# Patient Record
Sex: Female | Born: 1960 | Race: Black or African American | Hispanic: No | Marital: Single | Smoking: Former smoker
Health system: Southern US, Community
[De-identification: ages and names within clinical notes are randomized; demographics above are authoritative.]

## PROBLEM LIST (undated history)

## (undated) DIAGNOSIS — K219 Gastro-esophageal reflux disease without esophagitis: Secondary | ICD-10-CM

## (undated) DIAGNOSIS — D649 Anemia, unspecified: Secondary | ICD-10-CM

## (undated) DIAGNOSIS — E119 Type 2 diabetes mellitus without complications: Secondary | ICD-10-CM

## (undated) DIAGNOSIS — F419 Anxiety disorder, unspecified: Secondary | ICD-10-CM

## (undated) DIAGNOSIS — F329 Major depressive disorder, single episode, unspecified: Secondary | ICD-10-CM

## (undated) DIAGNOSIS — I10 Essential (primary) hypertension: Secondary | ICD-10-CM

## (undated) DIAGNOSIS — F32A Depression, unspecified: Secondary | ICD-10-CM

---

## 2013-06-09 ENCOUNTER — Emergency Department (HOSPITAL_COMMUNITY)
Admission: EM | Admit: 2013-06-09 | Discharge: 2013-06-09 | Disposition: A | Discharge status: Home / Self Care | Payer: Medicaid Other | Attending: Emergency Medicine | Admitting: Emergency Medicine

## 2013-06-09 ENCOUNTER — Encounter (HOSPITAL_COMMUNITY): Payer: Self-pay | Admitting: Emergency Medicine

## 2013-06-09 ENCOUNTER — Emergency Department (HOSPITAL_COMMUNITY): Payer: Medicaid Other

## 2013-06-09 DIAGNOSIS — R5381 Other malaise: Secondary | ICD-10-CM | POA: Insufficient documentation

## 2013-06-09 DIAGNOSIS — Z87891 Personal history of nicotine dependence: Secondary | ICD-10-CM | POA: Insufficient documentation

## 2013-06-09 DIAGNOSIS — N939 Abnormal uterine and vaginal bleeding, unspecified: Secondary | ICD-10-CM

## 2013-06-09 DIAGNOSIS — Z3202 Encounter for pregnancy test, result negative: Secondary | ICD-10-CM | POA: Insufficient documentation

## 2013-06-09 DIAGNOSIS — E119 Type 2 diabetes mellitus without complications: Secondary | ICD-10-CM | POA: Insufficient documentation

## 2013-06-09 DIAGNOSIS — R112 Nausea with vomiting, unspecified: Secondary | ICD-10-CM | POA: Insufficient documentation

## 2013-06-09 DIAGNOSIS — Z880 Allergy status to penicillin: Secondary | ICD-10-CM | POA: Insufficient documentation

## 2013-06-09 DIAGNOSIS — N949 Unspecified condition associated with female genital organs and menstrual cycle: Secondary | ICD-10-CM | POA: Insufficient documentation

## 2013-06-09 DIAGNOSIS — I10 Essential (primary) hypertension: Secondary | ICD-10-CM | POA: Insufficient documentation

## 2013-06-09 DIAGNOSIS — N938 Other specified abnormal uterine and vaginal bleeding: Secondary | ICD-10-CM | POA: Insufficient documentation

## 2013-06-09 DIAGNOSIS — Z7982 Long term (current) use of aspirin: Secondary | ICD-10-CM | POA: Insufficient documentation

## 2013-06-09 DIAGNOSIS — Z79899 Other long term (current) drug therapy: Secondary | ICD-10-CM | POA: Insufficient documentation

## 2013-06-09 DIAGNOSIS — R0602 Shortness of breath: Secondary | ICD-10-CM | POA: Insufficient documentation

## 2013-06-09 HISTORY — DX: Essential (primary) hypertension: I10

## 2013-06-09 HISTORY — DX: Type 2 diabetes mellitus without complications: E11.9

## 2013-06-09 LAB — ABO/RH: ABO/RH(D): O POS

## 2013-06-09 LAB — POCT I-STAT, CHEM 8
Calcium, Ion: 1.1 mmol/L — ABNORMAL LOW (ref 1.12–1.23)
Glucose, Bld: 209 mg/dL — ABNORMAL HIGH (ref 70–99)
HCT: 28 % — ABNORMAL LOW (ref 36.0–46.0)
Hemoglobin: 9.5 g/dL — ABNORMAL LOW (ref 12.0–15.0)
TCO2: 21 mmol/L (ref 0–100)

## 2013-06-09 LAB — CBC WITH DIFFERENTIAL/PLATELET
HCT: 23.2 % — ABNORMAL LOW (ref 36.0–46.0)
Hemoglobin: 7.7 g/dL — ABNORMAL LOW (ref 12.0–15.0)
Lymphs Abs: 2.2 10*3/uL (ref 0.7–4.0)
MCH: 24.9 pg — ABNORMAL LOW (ref 26.0–34.0)
Monocytes Absolute: 1 10*3/uL (ref 0.1–1.0)
Monocytes Relative: 7 % (ref 3–12)
Neutro Abs: 10.5 10*3/uL — ABNORMAL HIGH (ref 1.7–7.7)
Neutrophils Relative %: 77 % (ref 43–77)
RBC: 3.09 MIL/uL — ABNORMAL LOW (ref 3.87–5.11)

## 2013-06-09 LAB — WET PREP, GENITAL
Clue Cells Wet Prep HPF POC: NONE SEEN
Trich, Wet Prep: NONE SEEN

## 2013-06-09 LAB — URINALYSIS, ROUTINE W REFLEX MICROSCOPIC
Bilirubin Urine: NEGATIVE
Ketones, ur: >80 mg/dL — AB
Nitrite: NEGATIVE
Protein, ur: 30 mg/dL — AB
Urobilinogen, UA: 0.2 mg/dL (ref 0.0–1.0)

## 2013-06-09 LAB — URINE MICROSCOPIC-ADD ON

## 2013-06-09 LAB — TYPE AND SCREEN: Antibody Screen: NEGATIVE

## 2013-06-09 MED ORDER — FERROUS SULFATE 325 (65 FE) MG PO TABS: 325.0000 mg | ORAL_TABLET | Freq: Every day | ORAL | Status: DC

## 2013-06-09 MED ORDER — MEGESTROL ACETATE 40 MG PO TABS: 40.0000 mg | ORAL_TABLET | Freq: Four times a day (QID) | ORAL | Status: DC

## 2013-06-09 MED ORDER — POTASSIUM CHLORIDE CRYS ER 20 MEQ PO TBCR
40.0000 meq | EXTENDED_RELEASE_TABLET | Freq: Once | ORAL | Status: AC
  Administered 2013-06-09: 40 meq via ORAL
  Filled 2013-06-09: qty 2

## 2013-06-09 MED ORDER — SODIUM CHLORIDE 0.9 % IV BOLUS (SEPSIS)
1000.0000 mL | Freq: Once | INTRAVENOUS | Status: AC
  Administered 2013-06-09: 1000 mL via INTRAVENOUS

## 2013-06-09 NOTE — ED Notes (Signed)
ZOX:[id number]<AV> Expected date:<BR> Expected time:<BR> Means of arrival:<BR> Comments:<BR> EMS-N/V

## 2013-06-09 NOTE — ED Notes (Addendum)
Per EMS. Pt started feeling weak a 2 weeks ago and started having vaginal bleeding with clots that has been intermittent ever since. Pt also noted that her abdomen was distended. Pt reports "I have lost lots of blood" and "I feel week". Pt report not wanting to eat. Some nausea and vomiting today. CBG 178.

## 2013-06-09 NOTE — ED Provider Notes (Signed)
History    CSN: [id number] Arrival date & time 06/09/13  1343  First MD Initiated Contact with Patient 06/09/13 1416     Chief Complaint  Patient presents with  . Vaginal Bleeding   (Consider location/radiation/quality/duration/timing/severity/associated sxs/prior Treatment) HPI Comments: Patient presents to the emergency department with chief complaint of vaginal bleeding. She states that she has been noticing dark red clots from the vagina for 8 days. She states that she's been using approximately 18 pads per day. Additionally, she states that she feels very weak and out of breath. She states that several days ago her stomach was distended, but it is no longer. She denies any chest pain, abdominal pain, diarrhea or constipation. She endorses associated nausea and vomiting which started today. She denies any pain.  Patient is a 52 y.o. female presenting with vaginal bleeding. The history is provided by the patient. No language interpreter was used.  Vaginal Bleeding Quality:  Dark red and heavier than menses Severity:  Moderate Onset quality:  Gradual Duration:  8 days Timing:  Constant Progression:  Unchanged Chronicity:  New Menstrual history: Perimenopause. Number of pads used:  18 per day Possible pregnancy: no   Relieved by:  Nothing Worsened by:  Nothing tried Ineffective treatments:  None tried Associated symptoms: fatigue   Associated symptoms: no abdominal pain, no back pain, no dizziness, no dyspareunia, no dysuria, no fever, no nausea and no vaginal discharge   Associated symptoms comment:  Shortness of breath  Past Medical History  Diagnosis Date  . Hypertension   . Diabetes mellitus without complication    No past surgical history on file. No family history on file. History  Substance Use Topics  . Smoking status: Former Games developer  . Smokeless tobacco: Not on file  . Alcohol Use: No   OB History   Grav Para Term Preterm Abortions TAB SAB Ect Mult Living                Review of Systems  Constitutional: Positive for fatigue. Negative for fever.  Gastrointestinal: Negative for nausea and abdominal pain.  Genitourinary: Positive for vaginal bleeding. Negative for dysuria, vaginal discharge and dyspareunia.  Musculoskeletal: Negative for back pain.  Neurological: Negative for dizziness.  All other systems reviewed and are negative.    Allergies  Penicillins  Home Medications   Current Outpatient Rx  Name  Route  Sig  Dispense  Refill  . acetaminophen (TYLENOL) 500 MG tablet   Oral   Take 500 mg by mouth every 6 (six) hours as needed for pain.         Marland Kitchen aspirin 81 MG tablet   Oral   Take 81 mg by mouth daily.         Marland Kitchen atenolol (TENORMIN) 50 MG tablet   Oral   Take 50 mg by mouth daily.         . cloNIDine (CATAPRES) 0.1 MG tablet   Oral   Take 0.1 mg by mouth 2 (two) times daily.         . Fish Oil OIL   Oral   Take 1 capsule by mouth daily.         Marland Kitchen glipiZIDE (GLUCOTROL) 5 MG tablet   Oral   Take 10 mg by mouth 2 (two) times daily before a meal.          . hydrochlorothiazide (HYDRODIURIL) 25 MG tablet   Oral   Take 25 mg by mouth daily.         Marland Kitchen  metFORMIN (GLUCOPHAGE) 850 MG tablet   Oral   Take 850 mg by mouth 2 (two) times daily with a meal.         . SLO-NIACIN PO   Oral   Take 1 tablet by mouth daily.          BP 161/85  Pulse 102  Temp(Src) 98.4 F (36.9 C) (Oral)  Resp 22  SpO2 100%  LMP 05/26/2013 Physical Exam  Nursing note and vitals reviewed. Constitutional: She is oriented to person, place, and time. She appears well-developed and well-nourished.  HENT:  Head: Normocephalic and atraumatic.  Eyes: Conjunctivae and EOM are normal. Pupils are equal, round, and reactive to light.  Neck: Normal range of motion. Neck supple.  Cardiovascular: Normal rate and regular rhythm.  Exam reveals no gallop and no friction rub.   No murmur heard. Pulmonary/Chest: Effort normal and  breath sounds normal. No respiratory distress. She has no wheezes. She has no rales. She exhibits no tenderness.  Abdominal: Soft. Bowel sounds are normal. She exhibits no distension and no mass. There is no tenderness. There is no rebound and no guarding.  Musculoskeletal: Normal range of motion. She exhibits no edema and no tenderness.  Neurological: She is alert and oriented to person, place, and time.  Skin: Skin is warm and dry.  Psychiatric: She has a normal mood and affect. Her behavior is normal. Judgment and thought content normal.    ED Course  Procedures (including critical care time) Labs Reviewed  WET PREP, GENITAL  GC/CHLAMYDIA PROBE AMP  CBC WITH DIFFERENTIAL  URINALYSIS, ROUTINE W REFLEX MICROSCOPIC  TYPE AND SCREEN  ED ECG REPORT  I personally interpreted this EKG   Date: 06/09/2013   Rate: 91  Rhythm: normal sinus rhythm  QRS Axis: normal  Intervals: normal  ST/T Wave abnormalities: normal  Conduction Disutrbances:none  Narrative Interpretation:   Old EKG Reviewed: none available   Results for orders placed during the hospital encounter of 06/09/13  WET PREP, GENITAL      Result Value Range   Yeast Wet Prep HPF POC NONE SEEN  NONE SEEN   Trich, Wet Prep NONE SEEN  NONE SEEN   Clue Cells Wet Prep HPF POC NONE SEEN  NONE SEEN   WBC, Wet Prep HPF POC FEW (*) NONE SEEN  CBC WITH DIFFERENTIAL      Result Value Range   WBC 13.7 (*) 4.0 - 10.5 K/uL   RBC 3.09 (*) 3.87 - 5.11 MIL/uL   Hemoglobin 7.7 (*) 12.0 - 15.0 g/dL   HCT 45.4 (*) 09.8 - 11.9 %   MCV 75.1 (*) 78.0 - 100.0 fL   MCH 24.9 (*) 26.0 - 34.0 pg   MCHC 33.2  30.0 - 36.0 g/dL   RDW 14.7 (*) 82.9 - 56.2 %   Platelets 741 (*) 150 - 400 K/uL   Neutrophils Relative % 77  43 - 77 %   Neutro Abs 10.5 (*) 1.7 - 7.7 K/uL   Lymphocytes Relative 16  12 - 46 %   Lymphs Abs 2.2  0.7 - 4.0 K/uL   Monocytes Relative 7  3 - 12 %   Monocytes Absolute 1.0  0.1 - 1.0 K/uL   Eosinophils Relative 0  0 - 5 %    Eosinophils Absolute 0.0  0.0 - 0.7 K/uL   Basophils Relative 0  0 - 1 %   Basophils Absolute 0.0  0.0 - 0.1 K/uL  POCT I-STAT, CHEM 8  Result Value Range   Sodium 136  135 - 145 mEq/L   Potassium 3.1 (*) 3.5 - 5.1 mEq/L   Chloride 100  96 - 112 mEq/L   BUN 11  6 - 23 mg/dL   Creatinine, Ser 1.61  0.50 - 1.10 mg/dL   Glucose, Bld 096 (*) 70 - 99 mg/dL   Calcium, Ion 0.45 (*) 1.12 - 1.23 mmol/L   TCO2 21  0 - 100 mmol/L   Hemoglobin 9.5 (*) 12.0 - 15.0 g/dL   HCT 40.9 (*) 81.1 - 91.4 %  POCT I-STAT TROPONIN I      Result Value Range   Troponin i, poc 0.00  0.00 - 0.08 ng/mL   Comment 3           POCT PREGNANCY, URINE      Result Value Range   Preg Test, Ur NEGATIVE  NEGATIVE   Dg Chest Port 1 View  06/09/2013   *RADIOLOGY REPORT*  Clinical Data: Short of breath  PORTABLE CHEST - 1 VIEW  Comparison: None.  Findings: The lungs are well-aerated and free from pulmonary edema, focal airspace consolidation or pulmonary nodule.  Cardiac and mediastinal contours are within normal limits.  No pneumothorax, or pleural effusion. No acute osseous findings. Degenerative changes in the bilateral acromioclavicular joints.    IMPRESSION:  No acute cardiopulmonary disease.   Original Report Authenticated By: Malachy Moan, M.D.     1. Vaginal bleeding     MDM  Patient with vaginal bleeding x 8 days.  18 pads per day.  With associated SOB.  However, no pain.  Discussed with Dr. Jodi Mourning, who recommends calling Gyn and getting their input regarding outpatient f/u.  3:34 PM Discussed the patient with Dr. Shawnie Pons from [facility].  Dr. Tawni Levy office will arrange for follow-up.  She recommends starting 40 mg Megase QID and tapering down to BID as bleeding stops and an iron supplement.  Outpatient imaging.  4:03 PM Patient seen by and discussed with Dr. Jodi Mourning, who agrees with plan.       Roxy Horseman, PA-C 06/09/13 [phone]

## 2013-06-10 ENCOUNTER — Observation Stay (HOSPITAL_COMMUNITY): Payer: Medicaid Other

## 2013-06-10 ENCOUNTER — Encounter: Payer: Self-pay | Admitting: Obstetrics & Gynecology

## 2013-06-10 ENCOUNTER — Observation Stay (HOSPITAL_COMMUNITY)
Admission: AD | Admit: 2013-06-10 | Discharge: 2013-06-11 | Disposition: A | Discharge status: Home / Self Care | Payer: Medicaid Other | Source: Ambulatory Visit | Attending: Obstetrics & Gynecology | Admitting: Obstetrics & Gynecology

## 2013-06-10 ENCOUNTER — Encounter (HOSPITAL_COMMUNITY): Payer: Self-pay | Admitting: *Deleted

## 2013-06-10 ENCOUNTER — Ambulatory Visit (INDEPENDENT_AMBULATORY_CARE_PROVIDER_SITE_OTHER): Payer: Medicaid Other | Admitting: Obstetrics & Gynecology

## 2013-06-10 VITALS — Ht 62.0 in

## 2013-06-10 DIAGNOSIS — N920 Excessive and frequent menstruation with regular cycle: Secondary | ICD-10-CM

## 2013-06-10 DIAGNOSIS — D500 Iron deficiency anemia secondary to blood loss (chronic): Secondary | ICD-10-CM | POA: Insufficient documentation

## 2013-06-10 DIAGNOSIS — D219 Benign neoplasm of connective and other soft tissue, unspecified: Secondary | ICD-10-CM

## 2013-06-10 DIAGNOSIS — I10 Essential (primary) hypertension: Secondary | ICD-10-CM | POA: Insufficient documentation

## 2013-06-10 DIAGNOSIS — E119 Type 2 diabetes mellitus without complications: Secondary | ICD-10-CM | POA: Insufficient documentation

## 2013-06-10 DIAGNOSIS — N841 Polyp of cervix uteri: Secondary | ICD-10-CM | POA: Insufficient documentation

## 2013-06-10 DIAGNOSIS — N83209 Unspecified ovarian cyst, unspecified side: Secondary | ICD-10-CM

## 2013-06-10 DIAGNOSIS — D649 Anemia, unspecified: Secondary | ICD-10-CM

## 2013-06-10 DIAGNOSIS — D259 Leiomyoma of uterus, unspecified: Secondary | ICD-10-CM | POA: Insufficient documentation

## 2013-06-10 LAB — CBC
HCT: 21.7 % — ABNORMAL LOW (ref 36.0–46.0)
MCV: 75.6 fL — ABNORMAL LOW (ref 78.0–100.0)
Platelets: 643 10*3/uL — ABNORMAL HIGH (ref 150–400)
RBC: 2.87 MIL/uL — ABNORMAL LOW (ref 3.87–5.11)
WBC: 12.2 10*3/uL — ABNORMAL HIGH (ref 4.0–10.5)

## 2013-06-10 LAB — GC/CHLAMYDIA PROBE AMP
CT Probe RNA: NEGATIVE
GC Probe RNA: NEGATIVE

## 2013-06-10 LAB — HEMOGLOBIN A1C: Hgb A1c MFr Bld: 6.8 % — ABNORMAL HIGH (ref <5.7)

## 2013-06-10 LAB — ABO/RH: ABO/RH(D): O POS

## 2013-06-10 LAB — PREPARE RBC (CROSSMATCH)

## 2013-06-10 MED ORDER — ATENOLOL 50 MG PO TABS
50.0000 mg | ORAL_TABLET | Freq: Two times a day (BID) | ORAL | Status: DC
  Filled 2013-06-10 (×2): qty 1

## 2013-06-10 MED ORDER — PRENATAL MULTIVITAMIN CH: 1.0000 | ORAL_TABLET | Freq: Every day | ORAL | Status: DC

## 2013-06-10 MED ORDER — IBUPROFEN 600 MG PO TABS: 600.0000 mg | ORAL_TABLET | Freq: Four times a day (QID) | ORAL | Status: DC | PRN

## 2013-06-10 MED ORDER — FUROSEMIDE 10 MG/ML IJ SOLN
20.0000 mg | Freq: Once | INTRAMUSCULAR | Status: DC
  Filled 2013-06-10: qty 2

## 2013-06-10 MED ORDER — ZOLPIDEM TARTRATE 5 MG PO TABS: 5.0000 mg | ORAL_TABLET | Freq: Every evening | ORAL | Status: DC | PRN

## 2013-06-10 MED ORDER — DIPHENHYDRAMINE HCL 25 MG PO CAPS
25.0000 mg | ORAL_CAPSULE | Freq: Once | ORAL | Status: AC
  Administered 2013-06-10: 25 mg via ORAL
  Filled 2013-06-10: qty 1

## 2013-06-10 MED ORDER — INSULIN ASPART 100 UNIT/ML ~~LOC~~ SOLN: 0.0000 [IU] | Freq: Three times a day (TID) | SUBCUTANEOUS | Status: DC

## 2013-06-10 MED ORDER — SODIUM CHLORIDE 0.9 % IV SOLN
INTRAVENOUS | Status: DC
  Administered 2013-06-10 – 2013-06-11 (×2): via INTRAVENOUS

## 2013-06-10 MED ORDER — ACETAMINOPHEN 325 MG PO TABS
650.0000 mg | ORAL_TABLET | Freq: Once | ORAL | Status: AC
  Administered 2013-06-10: 650 mg via ORAL
  Filled 2013-06-10: qty 2

## 2013-06-10 MED ORDER — CLONIDINE HCL 0.1 MG PO TABS
0.1000 mg | ORAL_TABLET | Freq: Two times a day (BID) | ORAL | Status: DC
  Filled 2013-06-10 (×2): qty 1

## 2013-06-10 MED ORDER — HYDROCHLOROTHIAZIDE 25 MG PO TABS
25.0000 mg | ORAL_TABLET | Freq: Every day | ORAL | Status: DC
  Filled 2013-06-10: qty 1

## 2013-06-10 MED ORDER — ONDANSETRON HCL 4 MG PO TABS: 4.0000 mg | ORAL_TABLET | Freq: Four times a day (QID) | ORAL | Status: DC | PRN

## 2013-06-10 MED ORDER — ONDANSETRON HCL 4 MG/2ML IJ SOLN: 4.0000 mg | Freq: Four times a day (QID) | INTRAMUSCULAR | Status: DC | PRN

## 2013-06-10 NOTE — H&P (Signed)
Patient ID: Marie Fox, female DOB: [birth year], 52 y.o. MRN: [id number]  HPI  52 yo G0P0 Patient's last menstrual period was 05/26/2013. AA lady who was seen in the ER yesterday with heavy VB. A wet prep and cervical cultures were sent and she was started on megace 40 mg daily. She reports that over the last year her periods have been irregular, skipping and then bleeding very heavily the next month. She has also had some hot flashes. Her hbg was 7.7  Past Medical History  Diagnosis Date  . Hypertension   . Diabetes mellitus without complication   No past surgical history on file. No current facility-administered medications on file prior to encounter.   Current Outpatient Prescriptions on File Prior to Encounter  Medication Sig Dispense Refill  . acetaminophen (TYLENOL) 500 MG tablet Take 500 mg by mouth every 6 (six) hours as needed for pain.      Marland Kitchen aspirin 81 MG tablet Take 81 mg by mouth daily.      Marland Kitchen atenolol (TENORMIN) 50 MG tablet Take 50 mg by mouth daily.      . cloNIDine (CATAPRES) 0.1 MG tablet Take 0.1 mg by mouth 2 (two) times daily.      . ferrous sulfate 325 (65 FE) MG tablet Take 1 tablet (325 mg total) by mouth daily.  30 tablet  0  . Fish Oil OIL Take 1 capsule by mouth daily.      Marland Kitchen glipiZIDE (GLUCOTROL) 5 MG tablet Take 10 mg by mouth 2 (two) times daily before a meal.       . hydrochlorothiazide (HYDRODIURIL) 25 MG tablet Take 25 mg by mouth daily.      . megestrol (MEGACE) 40 MG tablet Take 1 tablet (40 mg total) by mouth 4 (four) times daily.  28 tablet  1  . metFORMIN (GLUCOPHAGE) 850 MG tablet Take 850 mg by mouth 2 (two) times daily with a meal.      . SLO-NIACIN PO Take 1 tablet by mouth daily.       Allergies  Allergen Reactions  . Penicillins Nausea Only   History   Social History  . Marital Status: Unknown    Spouse Name: N/A    Number of Children: N/A  . Years of Education: N/A   Occupational History  . Not on file.   Social History Main Topics  .  Smoking status: Former Games developer  . Smokeless tobacco: Not on file  . Alcohol Use: No  . Drug Use: Not on file  . Sexually Active: Not Currently   Other Topics Concern  . Not on file   Social History Narrative  . No narrative on file     Review of Systems Nausea and emesis occasionally. No fever. Bleeding per vagina. No pain, no discharge. Feels tired, weak  No recent pap smear.  Objective:   Physical Exam  Filed Vitals:   06/10/13 1636  BP: 122/74  Pulse: 88  Temp: 98.4 F (36.9 C)  TempSrc: Oral  Resp: 18  SpO2: 99%  NAD, pleasant Chest clear Cor RRR Abdomen not distended 5 cm prolapsed fibroid, moderate to large amount of bleeding noted  Bimanual exam reveals a ULN size uterus and non-palpable adnexa  CBC    Component Value Date/Time   WBC 12.2* 06/10/2013 1535   RBC 2.87* 06/10/2013 1535   HGB 7.2* 06/10/2013 1535   HCT 21.7* 06/10/2013 1535   PLT 643* 06/10/2013 1535   MCV 75.6* 06/10/2013 1535  MCH 25.1* 06/10/2013 1535   MCHC 33.2 06/10/2013 1535   RDW 18.4* 06/10/2013 1535   LYMPHSABS 2.2 06/09/2013 1440   MONOABS 1.0 06/09/2013 1440   EOSABS 0.0 06/09/2013 1440   BASOSABS 0.0 06/09/2013 1440    CMP     Component Value Date/Time   NA 136 06/09/2013 1503   K 3.1* 06/09/2013 1503   CL 100 06/09/2013 1503   GLUCOSE 209* 06/09/2013 1503   BUN 11 06/09/2013 1503   CREATININE 1.10 06/09/2013 1503      Assessment & Plan:   She will be admitted overnight for an U/S and transfusion of 3 units of PRBC in preparation for removal of fibroid in the OR tomorrow. She was seen in clinic by Dr. Marice Potter who counseled the patient and scheduled the surgery for her to perform.  Adam Phenix, MD 06/10/2013 4:44 PM

## 2013-06-10 NOTE — MAU Note (Signed)
Patient is to be a direct admit to the Women's Unit.

## 2013-06-10 NOTE — Progress Notes (Signed)
  Subjective:    Patient ID: Marie Fox, female    DOB: [birth year], 52 y.o.   MRN: [id number]  HPI  52 yo AA lady who was seen in the ER yesterday with heavy VB. A wet prep and cervical cultures were sent and she was started on megace 40 mg daily. She reports that over the last year her periods have been irregular, skipping and then bleeding very heavily the next month. She has also had some hot flashes. Her hbg was 7.7  Review of Systems    No recent pap smear. Objective:   Physical Exam  5 cm prolapsed fibroid, moderate to large amount of bleeding noted Bimanual exam reveals a ULN size uterus and non-palpable adnexa      Assessment & Plan:  She will be admitted overnight for an u/s and transfusion of 3 units of PRBC in preparation for removal of fibroid in the OR tomorrow.

## 2013-06-10 NOTE — ED Provider Notes (Signed)
Medical screening examination/treatment/procedure(s) were conducted as a shared visit with non-physician practitioner(s) or resident  and myself.  I personally evaluated the patient during the encounter and agree with the findings and plan unless otherwise indicated.  Anemia with vaginal bleeding.  Concern for hormonal vs less likely malignancy. Pt denies blood thinners. PA spoke with specialist for close outpt fup. Well appearing on exam.  Medicines given at dc.  Lightheadedness resolved with fluids.  Fup discussed.   Enid Skeens, MD 06/10/13 [phone]

## 2013-06-11 ENCOUNTER — Ambulatory Visit (HOSPITAL_COMMUNITY)
Admission: RE | Admit: 2013-06-11 | Payer: Medicaid Other | Source: Ambulatory Visit | Admitting: Obstetrics & Gynecology

## 2013-06-11 ENCOUNTER — Observation Stay (HOSPITAL_COMMUNITY): Payer: Medicaid Other | Admitting: Anesthesiology

## 2013-06-11 ENCOUNTER — Encounter (HOSPITAL_COMMUNITY): Payer: Self-pay | Admitting: Anesthesiology

## 2013-06-11 ENCOUNTER — Encounter (HOSPITAL_COMMUNITY)
Admission: AD | Disposition: A | Discharge status: Home / Self Care | Payer: Self-pay | Source: Ambulatory Visit | Attending: Obstetrics & Gynecology

## 2013-06-11 DIAGNOSIS — N920 Excessive and frequent menstruation with regular cycle: Principal | ICD-10-CM

## 2013-06-11 DIAGNOSIS — D259 Leiomyoma of uterus, unspecified: Secondary | ICD-10-CM

## 2013-06-11 DIAGNOSIS — D219 Benign neoplasm of connective and other soft tissue, unspecified: Secondary | ICD-10-CM

## 2013-06-11 HISTORY — PX: HYSTEROSCOPY WITH D & C: SHX1775

## 2013-06-11 SURGERY — DILATATION AND CURETTAGE /HYSTEROSCOPY
Anesthesia: General | Site: Vagina | Wound class: Contaminated

## 2013-06-11 MED ORDER — ONDANSETRON HCL 4 MG/2ML IJ SOLN
INTRAMUSCULAR | Status: DC | PRN
  Administered 2013-06-11: 4 mg via INTRAVENOUS

## 2013-06-11 MED ORDER — FENTANYL CITRATE 0.05 MG/ML IJ SOLN
INTRAMUSCULAR | Status: DC | PRN
  Administered 2013-06-11: 100 ug via INTRAVENOUS

## 2013-06-11 MED ORDER — FENTANYL CITRATE 0.05 MG/ML IJ SOLN: 25.0000 ug | INTRAMUSCULAR | Status: DC | PRN

## 2013-06-11 MED ORDER — KETOROLAC TROMETHAMINE 30 MG/ML IJ SOLN: 15.0000 mg | Freq: Once | INTRAMUSCULAR | Status: DC | PRN

## 2013-06-11 MED ORDER — BUPIVACAINE HCL (PF) 0.5 % IJ SOLN
INTRAMUSCULAR | Status: DC | PRN
  Administered 2013-06-11: 10 mL

## 2013-06-11 MED ORDER — MIDAZOLAM HCL 5 MG/5ML IJ SOLN
INTRAMUSCULAR | Status: DC | PRN
  Administered 2013-06-11: 2 mg via INTRAVENOUS

## 2013-06-11 MED ORDER — LIDOCAINE HCL (CARDIAC) 20 MG/ML IV SOLN
INTRAVENOUS | Status: DC | PRN
  Administered 2013-06-11: 80 mg via INTRAVENOUS

## 2013-06-11 MED ORDER — MIDAZOLAM HCL 2 MG/2ML IJ SOLN: 0.5000 mg | Freq: Once | INTRAMUSCULAR | Status: DC | PRN

## 2013-06-11 MED ORDER — MEPERIDINE HCL 25 MG/ML IJ SOLN: 6.2500 mg | INTRAMUSCULAR | Status: DC | PRN

## 2013-06-11 MED ORDER — PROMETHAZINE HCL 25 MG/ML IJ SOLN: 6.2500 mg | INTRAMUSCULAR | Status: DC | PRN

## 2013-06-11 MED ORDER — PROPOFOL 10 MG/ML IV BOLUS
INTRAVENOUS | Status: DC | PRN
  Administered 2013-06-11: 100 mg via INTRAVENOUS
  Administered 2013-06-11: 200 mg via INTRAVENOUS

## 2013-06-11 MED ORDER — LACTATED RINGERS IV SOLN
INTRAVENOUS | Status: DC | PRN
  Administered 2013-06-11: 08:00:00 via INTRAVENOUS

## 2013-06-11 MED ORDER — IBUPROFEN 800 MG PO TABS: 800.0000 mg | ORAL_TABLET | Freq: Three times a day (TID) | ORAL | Status: DC | PRN

## 2013-06-11 SURGICAL SUPPLY — 17 items
CANISTER SUCTION 2500CC (MISCELLANEOUS) ×2 IMPLANT
CATH ROBINSON RED A/P 16FR (CATHETERS) ×2 IMPLANT
CLOTH BEACON ORANGE TIMEOUT ST (SAFETY) ×2 IMPLANT
CONTAINER PREFILL 10% NBF 60ML (FORM) ×4 IMPLANT
DILATOR CANAL MILEX (MISCELLANEOUS) IMPLANT
DRESSING TELFA 8X3 (GAUZE/BANDAGES/DRESSINGS) ×2 IMPLANT
ELECT REM PT RETURN 9FT ADLT (ELECTROSURGICAL)
ELECTRODE REM PT RTRN 9FT ADLT (ELECTROSURGICAL) IMPLANT
GLOVE BIO SURGEON STRL SZ 6.5 (GLOVE) ×2 IMPLANT
GLOVE BIOGEL PI IND STRL 7.0 (GLOVE) ×1 IMPLANT
GLOVE BIOGEL PI INDICATOR 7.0 (GLOVE) ×1
GOWN STRL REIN XL XLG (GOWN DISPOSABLE) ×4 IMPLANT
LOOP ANGLED CUTTING 22FR (CUTTING LOOP) IMPLANT
PACK HYSTEROSCOPY LF (CUSTOM PROCEDURE TRAY) ×2 IMPLANT
PAD OB MATERNITY 4.3X12.25 (PERSONAL CARE ITEMS) ×2 IMPLANT
TOWEL OR 17X24 6PK STRL BLUE (TOWEL DISPOSABLE) ×4 IMPLANT
WATER STERILE IRR 1000ML POUR (IV SOLUTION) ×2 IMPLANT

## 2013-06-11 NOTE — Progress Notes (Signed)
To the OR.

## 2013-06-11 NOTE — Transfer of Care (Signed)
Immediate Anesthesia Transfer of Care Note  Patient: Marie Fox  Procedure(s) Performed: Procedure(s): DILATATION AND CURETTAGE /HYSTEROSCOPY/myomyectomy (N/A)  Patient Location: PACU  Anesthesia Type:General  Level of Consciousness: awake, alert  and oriented  Airway & Oxygen Therapy: Patient Spontanous Breathing and Patient connected to nasal cannula oxygen  Post-op Assessment: Report given to PACU RN and Post -op Vital signs reviewed and stable  Post vital signs: Reviewed and stable  Complications: No apparent anesthesia complications

## 2013-06-11 NOTE — Op Note (Signed)
06/10/2013 - 06/11/2013  8:20 AM  PATIENT:  Marie Fox  52 y.o. female  PRE-OPERATIVE DIAGNOSIS:  prolapse fibroid, menorrhagia, anemia  POST-OPERATIVE DIAGNOSIS:  same  PROCEDURE:  MYOMECTOMY AND CURETTAGE  SURGEON:  Surgeon(s) and Role:    * Allie Bossier, MD - Primary  PHYSICIAN ASSISTANT:   ASSISTANTS: none   ANESTHESIA:   general  EBL:  Total I/O In: -  Out: 5 [Blood:5]  BLOOD ADMINISTERED:none  DRAINS: none   LOCAL MEDICATIONS USED:  MARCAINE     SPECIMEN:  Source of Specimen:  fibroid and uterine curettings  DISPOSITION OF SPECIMEN:  PATHOLOGY  COUNTS:  YES  TOURNIQUET:  * No tourniquets in log *  DICTATION: .Dragon Dictation  PLAN OF CARE: Discharge to home after PACU  PATIENT DISPOSITION:  PACU - hemodynamically stable.   Delay start of Pharmacological VTE agent (>24hrs) due to surgical blood loss or risk of bleeding: not applicable  The risks, benefits, and alternatives were explained, understood, accepted. The consents were signed. All questions were answered. I did mention that if hemorrhage occurred I may have to do a hysterectomy. She was taken to the operating room and placed in the dorsal lithotomy position. General anesthesia was applied. Her vagina was prepped and draped in the usual fashion. A weighted speculum was placed in her vagina. The large pedunculated fibroid was noted. I did a paracervical block with 10 mL of 0.5% Marcaine. I then twisted the pedunculated fibroid on total it came off of its pedicle. No bleeding was noted. I then used a small curette to perform a curettage of the uterine lining. Hemostasis was noted. She was extubated and taken to the recovery room in stable condition. She tolerated the procedure well.

## 2013-06-11 NOTE — Anesthesia Procedure Notes (Signed)
Procedure Name: LMA Insertion Date/Time: 06/11/2013 8:25 AM Performed by: Isabella Bowens R Pre-anesthesia Checklist: Patient identified, Emergency Drugs available, Suction available, Patient being monitored and Timeout performed Patient Re-evaluated:Patient Re-evaluated prior to inductionOxygen Delivery Method: Circle system utilized Preoxygenation: Pre-oxygenation with 100% oxygen Intubation Type: IV induction Ventilation: Mask ventilation without difficulty LMA: LMA inserted LMA Size: 4.0 Number of attempts: 1 Placement Confirmation: breath sounds checked- equal and bilateral and positive ETCO2 Dental Injury: Teeth and Oropharynx as per pre-operative assessment

## 2013-06-11 NOTE — Discharge Summary (Signed)
Physician Discharge Summary  Patient ID: Marie Fox MRN: [id number] DOB/AGE: 08-22-1961 52 y.o.  Admit date: 06/10/2013 Discharge date: 06/11/2013  Admission Diagnoses: prolapsed fibroid, menorrhagia, anemia  Discharge Diagnoses: same Active Problems:   Fibroid   Menorrhagia   Discharged Condition: good  Hospital Course: She was admitted from the gyn clinic with menorrhagia and symptomatic anemia. Her hemoglobin was 7.2. She was given 3 units of PRBCs overnight and felt much better in the morning. An u/s done showed a thin endometrium of 2 mm and a prolapsed fibroid and a small complex right adnexal mass. She underwent an uncomplicated removal of the prolapsed fibroid as well as a curettage of her endometrium the following morning. A CA-125 was ordered prior to her discharge home.  Consults: None  Significant Diagnostic Studies: ultrasound as above  Treatments: surgery: as above as well as the transfusion  Discharge Exam: Blood pressure 104/68, pulse 85, temperature 98.2 F (36.8 C), temperature source Oral, resp. rate 20, last menstrual period 06/10/2013, SpO2 100.00%. General appearance: alert Cardio: regular rate and rhythm, S1, S2 normal, no murmur, click, rub or gallop GI: soft, non-tender; bowel sounds normal; no masses,  no organomegaly  Disposition: 01-Home or Self Care  Discharge Orders   Future Orders Complete By Expires     CA 125  As directed         Medication List    STOP taking these medications       megestrol 40 MG tablet  Commonly known as:  MEGACE      TAKE these medications       acetaminophen 500 MG tablet  Commonly known as:  TYLENOL  Take 1,000-1,500 mg by mouth every 6 (six) hours as needed for pain.     aspirin 81 MG tablet  Take 81 mg by mouth daily.     atenolol 50 MG tablet  Commonly known as:  TENORMIN  Take 50 mg by mouth daily.     cloNIDine 0.1 MG tablet  Commonly known as:  CATAPRES  Take 0.1 mg by mouth 2 (two) times daily.      diphenhydrAMINE 25 MG tablet  Commonly known as:  BENADRYL  Take 50 mg by mouth daily as needed for allergies.     ferrous sulfate 325 (65 FE) MG tablet  Take 1 tablet (325 mg total) by mouth daily.     Fish Oil 1200 MG Caps  Take 1 capsule by mouth daily.     glipiZIDE 5 MG tablet  Commonly known as:  GLUCOTROL  Take 5 mg by mouth 3 (three) times daily with meals.     hydrochlorothiazide 25 MG tablet  Commonly known as:  HYDRODIURIL  Take 25 mg by mouth daily.     ibuprofen 800 MG tablet  Commonly known as:  ADVIL,MOTRIN  Take 1 tablet (800 mg total) by mouth every 8 (eight) hours as needed for pain.     metFORMIN 850 MG tablet  Commonly known as:  GLUCOPHAGE  Take 850 mg by mouth 2 (two) times daily with a meal.     niacin 500 MG tablet  Take 500 mg by mouth at bedtime.     Vitamin D-3 1000 UNITS Caps  Take 1 capsule by mouth daily.           Follow-up Information   Follow up with Tanayah Squitieri C., MD. Schedule an appointment as soon as possible for a visit in 6 weeks.   Contact information:   [address]  [address] [place] [address] [phone]       Signed: Jadarious Dobbins C. 06/11/2013, 8:30 AM

## 2013-06-11 NOTE — Anesthesia Preprocedure Evaluation (Signed)
Anesthesia Evaluation  Patient identified by MRN, date of birth, ID band Patient awake    Reviewed: Allergy & Precautions, H&P , Patient's Chart, lab work & pertinent test results, reviewed documented beta blocker date and time   History of Anesthesia Complications Negative for: history of anesthetic complications  Airway Mallampati: II TM Distance: >3 FB Neck ROM: full    Dental no notable dental hx.    Pulmonary neg pulmonary ROS,  breath sounds clear to auscultation  Pulmonary exam normal       Cardiovascular Exercise Tolerance: Good hypertension, negative cardio ROS  Rhythm:regular Rate:Normal     Neuro/Psych negative neurological ROS  negative psych ROS   GI/Hepatic negative GI ROS, Neg liver ROS,   Endo/Other  negative endocrine ROSdiabetes  Renal/GU negative Renal ROS     Musculoskeletal   Abdominal   Peds  Hematology negative hematology ROS (+)   Anesthesia Other Findings Hypertension     Diabetes mellitus without complication   Reproductive/Obstetrics negative OB ROS                           Anesthesia Physical Anesthesia Plan  ASA: III  Anesthesia Plan: General LMA   Post-op Pain Management:    Induction:   Airway Management Planned:   Additional Equipment:   Intra-op Plan:   Post-operative Plan:   Informed Consent: I have reviewed the patients History and Physical, chart, labs and discussed the procedure including the risks, benefits and alternatives for the proposed anesthesia with the patient or authorized representative who has indicated his/her understanding and acceptance.   Dental Advisory Given  Plan Discussed with: CRNA, Surgeon and Anesthesiologist  Anesthesia Plan Comments:         Anesthesia Quick Evaluation

## 2013-06-11 NOTE — Anesthesia Postprocedure Evaluation (Signed)
  Anesthesia Post Note  Patient: Marie Fox  Procedure(s) Performed: Procedure(s) (LRB): DILATATION AND CURETTAGE /HYSTEROSCOPY/myomyectomy (N/A)  Anesthesia type: GA  Patient location: PACU  Post pain: Pain level controlled  Post assessment: Post-op Vital signs reviewed  Last Vitals:  Filed Vitals:   06/11/13 0845  BP: 126/75  Pulse: 72  Temp:   Resp: 16    Post vital signs: Reviewed  Level of consciousness: sedated  Complications: No apparent anesthesia complications

## 2013-06-12 ENCOUNTER — Encounter (HOSPITAL_COMMUNITY): Payer: Self-pay | Admitting: Obstetrics & Gynecology

## 2013-06-12 LAB — TYPE AND SCREEN
ABO/RH(D): O POS
Antibody Screen: NEGATIVE
Unit division: 0

## 2013-06-16 ENCOUNTER — Encounter: Payer: Self-pay | Admitting: Obstetrics & Gynecology

## 2013-07-16 ENCOUNTER — Encounter: Payer: Self-pay | Admitting: Obstetrics & Gynecology

## 2013-07-16 ENCOUNTER — Other Ambulatory Visit (HOSPITAL_COMMUNITY)
Admission: RE | Admit: 2013-07-16 | Discharge: 2013-07-16 | Disposition: A | Discharge status: Home / Self Care | Payer: Medicaid Other | Source: Ambulatory Visit | Attending: Obstetrics & Gynecology | Admitting: Obstetrics & Gynecology

## 2013-07-16 ENCOUNTER — Ambulatory Visit (INDEPENDENT_AMBULATORY_CARE_PROVIDER_SITE_OTHER): Payer: Medicaid Other | Admitting: Obstetrics & Gynecology

## 2013-07-16 VITALS — BP 167/100 | HR 92 | Temp 97.8°F | Ht 62.0 in | Wt 157.1 lb

## 2013-07-16 DIAGNOSIS — Z01419 Encounter for gynecological examination (general) (routine) without abnormal findings: Secondary | ICD-10-CM | POA: Insufficient documentation

## 2013-07-16 DIAGNOSIS — Z0000 Encounter for general adult medical examination without abnormal findings: Secondary | ICD-10-CM

## 2013-07-16 DIAGNOSIS — Z09 Encounter for follow-up examination after completed treatment for conditions other than malignant neoplasm: Secondary | ICD-10-CM

## 2013-07-16 DIAGNOSIS — Z1151 Encounter for screening for human papillomavirus (HPV): Secondary | ICD-10-CM | POA: Insufficient documentation

## 2013-07-16 NOTE — Progress Notes (Signed)
  Subjective:    Patient ID: Marie Fox, female    DOB: [birth year], 52 y.o.   MRN: [id number]  HPI  Ms. Cordle is here for a 4 week post op visit after having her prolapsed fibroid/uterine polyp removed as well as a d&c. She also had a transfusion of 3 units PRBCs. She has had no bleeding since her surgery and feels like she has so much more energy.  Review of Systems Her pap and mammogram are due.    Objective:   Physical Exam  Normal appearing cervix      Assessment & Plan:  Post op- stable Preventative care- pap with cotesting sent Mammogram ordered RTC 1 year/prn sooner

## 2013-08-04 ENCOUNTER — Ambulatory Visit (HOSPITAL_COMMUNITY): Payer: Medicaid Other

## 2014-05-26 ENCOUNTER — Encounter (HOSPITAL_COMMUNITY): Payer: Self-pay | Admitting: Emergency Medicine

## 2014-05-26 ENCOUNTER — Emergency Department (HOSPITAL_COMMUNITY): Payer: Medicaid Other

## 2014-05-26 ENCOUNTER — Emergency Department (HOSPITAL_COMMUNITY)
Admission: EM | Admit: 2014-05-26 | Discharge: 2014-05-26 | Disposition: A | Discharge status: Home / Self Care | Payer: Medicaid Other | Attending: Emergency Medicine | Admitting: Emergency Medicine

## 2014-05-26 DIAGNOSIS — E878 Other disorders of electrolyte and fluid balance, not elsewhere classified: Secondary | ICD-10-CM | POA: Diagnosis not present

## 2014-05-26 DIAGNOSIS — Z91040 Latex allergy status: Secondary | ICD-10-CM | POA: Insufficient documentation

## 2014-05-26 DIAGNOSIS — R197 Diarrhea, unspecified: Secondary | ICD-10-CM | POA: Diagnosis not present

## 2014-05-26 DIAGNOSIS — Z880 Allergy status to penicillin: Secondary | ICD-10-CM | POA: Insufficient documentation

## 2014-05-26 DIAGNOSIS — I10 Essential (primary) hypertension: Secondary | ICD-10-CM | POA: Diagnosis not present

## 2014-05-26 DIAGNOSIS — Z79899 Other long term (current) drug therapy: Secondary | ICD-10-CM | POA: Insufficient documentation

## 2014-05-26 DIAGNOSIS — R000 Tachycardia, unspecified: Secondary | ICD-10-CM | POA: Diagnosis not present

## 2014-05-26 DIAGNOSIS — E119 Type 2 diabetes mellitus without complications: Secondary | ICD-10-CM | POA: Insufficient documentation

## 2014-05-26 DIAGNOSIS — Z87891 Personal history of nicotine dependence: Secondary | ICD-10-CM | POA: Insufficient documentation

## 2014-05-26 DIAGNOSIS — F411 Generalized anxiety disorder: Secondary | ICD-10-CM | POA: Insufficient documentation

## 2014-05-26 DIAGNOSIS — R112 Nausea with vomiting, unspecified: Secondary | ICD-10-CM | POA: Diagnosis present

## 2014-05-26 LAB — HEMOGLOBIN A1C
Hgb A1c MFr Bld: 10.5 % — ABNORMAL HIGH (ref <5.7)
Mean Plasma Glucose: 255 mg/dL — ABNORMAL HIGH (ref <117)

## 2014-05-26 LAB — URINALYSIS, ROUTINE W REFLEX MICROSCOPIC
Bilirubin Urine: NEGATIVE
Glucose, UA: NEGATIVE mg/dL
Ketones, ur: >80 mg/dL — AB
Leukocytes, UA: NEGATIVE
Nitrite: NEGATIVE
Protein, ur: 100 mg/dL — AB
Specific Gravity, Urine: 1.02 (ref 1.005–1.030)
Urobilinogen, UA: 0.2 mg/dL (ref 0.0–1.0)
pH: 5.5 (ref 5.0–8.0)

## 2014-05-26 LAB — BASIC METABOLIC PANEL
Anion gap: 30 — ABNORMAL HIGH (ref 5–15)
BUN: 15 mg/dL (ref 6–23)
CO2: 15 mEq/L — ABNORMAL LOW (ref 19–32)
Calcium: 11 mg/dL — ABNORMAL HIGH (ref 8.4–10.5)
Chloride: 92 mEq/L — ABNORMAL LOW (ref 96–112)
Creatinine, Ser: 0.93 mg/dL (ref 0.50–1.10)
GFR calc Af Amer: 80 mL/min — ABNORMAL LOW (ref >90)
GFR calc non Af Amer: 69 mL/min — ABNORMAL LOW (ref >90)
Glucose, Bld: 239 mg/dL — ABNORMAL HIGH (ref 70–99)
Potassium: 3.2 mEq/L — ABNORMAL LOW (ref 3.7–5.3)
Sodium: 137 mEq/L (ref 137–147)

## 2014-05-26 LAB — CBC WITH DIFFERENTIAL/PLATELET
Basophils Absolute: 0 10*3/uL (ref 0.0–0.1)
Basophils Relative: 0 % (ref 0–1)
Eosinophils Absolute: 0.1 10*3/uL (ref 0.0–0.7)
Eosinophils Relative: 1 % (ref 0–5)
HCT: 42.9 % (ref 36.0–46.0)
Hemoglobin: 15.4 g/dL — ABNORMAL HIGH (ref 12.0–15.0)
Lymphocytes Relative: 34 % (ref 12–46)
Lymphs Abs: 3.1 10*3/uL (ref 0.7–4.0)
MCH: 29.2 pg (ref 26.0–34.0)
MCHC: 35.9 g/dL (ref 30.0–36.0)
MCV: 81.4 fL (ref 78.0–100.0)
Monocytes Absolute: 0.6 10*3/uL (ref 0.1–1.0)
Monocytes Relative: 7 % (ref 3–12)
Neutro Abs: 5.2 10*3/uL (ref 1.7–7.7)
Neutrophils Relative %: 58 % (ref 43–77)
Platelets: 515 10*3/uL — ABNORMAL HIGH (ref 150–400)
RBC: 5.27 MIL/uL — ABNORMAL HIGH (ref 3.87–5.11)
RDW: 12.8 % (ref 11.5–15.5)
WBC: 9 10*3/uL (ref 4.0–10.5)

## 2014-05-26 LAB — URINE MICROSCOPIC-ADD ON

## 2014-05-26 LAB — LACTIC ACID, PLASMA: Lactic Acid, Venous: 1.8 mmol/L (ref 0.5–2.2)

## 2014-05-26 LAB — CBG MONITORING, ED: Glucose-Capillary: 177 mg/dL — ABNORMAL HIGH (ref 70–99)

## 2014-05-26 MED ORDER — ONDANSETRON HCL 4 MG/2ML IJ SOLN
4.0000 mg | Freq: Once | INTRAMUSCULAR | Status: AC
  Administered 2014-05-26: 4 mg via INTRAVENOUS
  Filled 2014-05-26: qty 2

## 2014-05-26 MED ORDER — SODIUM CHLORIDE 0.9 % IV BOLUS (SEPSIS)
1000.0000 mL | Freq: Once | INTRAVENOUS | Status: AC
  Administered 2014-05-26: 1000 mL via INTRAVENOUS

## 2014-05-26 MED ORDER — LORAZEPAM 2 MG/ML IJ SOLN
1.5000 mg | Freq: Once | INTRAMUSCULAR | Status: AC
  Administered 2014-05-26: 1.5 mg via INTRAVENOUS
  Filled 2014-05-26: qty 1

## 2014-05-26 MED ORDER — INSULIN ASPART 100 UNIT/ML ~~LOC~~ SOLN: 5.0000 [IU] | Freq: Once | SUBCUTANEOUS | Status: DC

## 2014-05-26 MED ORDER — POTASSIUM CHLORIDE CRYS ER 20 MEQ PO TBCR
40.0000 meq | EXTENDED_RELEASE_TABLET | Freq: Once | ORAL | Status: AC
  Administered 2014-05-26: 40 meq via ORAL
  Filled 2014-05-26: qty 2

## 2014-05-26 NOTE — ED Notes (Signed)
Notified pt about urine sample. Pt requesting something to drink. Pt has fluids running. Will check back when bag is half finished

## 2014-05-26 NOTE — Discharge Instructions (Signed)
°Emergency Department Resource Guide °1) Find a Doctor and Pay Out of Pocket °Although you won't have to find out who is covered by your insurance plan, it is a good idea to ask around and get recommendations. You will then need to call the office and see if the doctor you have chosen will accept you as a new patient and what types of options they offer for patients who are self-pay. Some doctors offer discounts or will set up payment plans for their patients who do not have insurance, but you will need to ask so you aren't surprised when you get to your appointment. ° °2) Contact Your Local Health Department °Not all health departments have doctors that can see patients for sick visits, but many do, so it is worth a call to see if yours does. If you don't know where your local health department is, you can check in your phone book. The CDC also has a tool to help you locate your state's health department, and many state websites also have listings of all of their local health departments. ° °3) Find a Walk-in Clinic °If your illness is not likely to be very severe or complicated, you may want to try a walk in clinic. These are popping up all over the country in pharmacies, drugstores, and shopping centers. They're usually staffed by nurse practitioners or physician assistants that have been trained to treat common illnesses and complaints. They're usually fairly quick and inexpensive. However, if you have serious medical issues or chronic medical problems, these are probably not your best option. ° °No Primary Care Doctor: °- Call [facility] at  832-8000 - they can help you locate a primary care doctor that  accepts your insurance, provides certain services, etc. °- Physician Referral Service- 1-800-533-3463 ° °Chronic Pain Problems: °Organization         Address  Phone   Notes  °[facility]  (336) 297-2271 Patients need to be referred by their primary care doctor.  ° °Medication  Assistance: °Organization         Address  Phone   Notes  °[place] [facility] [address] °[place], [place] [address] (336) 641-8030 --Must be a resident of [place] °-- Must have NO insurance coverage whatsoever (no Medicaid/ Medicare, etc.) °-- The pt. MUST have a primary care doctor that directs their care regularly and follows them in the community °  °[facility]  (866) 331-1348   °[employer]  (888) 892-1162   ° °Agencies that provide inexpensive medical care: °Organization         Address  Phone   Notes  °[facility]  (336) 832-8035   °[facility]    (336) 832-7272   °[facility] [address] °[place], [place] [address] (336) 832-4777   °[facility] [address], °[place] (336) 271-4999   °[facility]    (336) 373-0678   °[facility]    (336) 272-1050   °[facility] ° [address], [place] Phone:  (336) 832-4444, Fax:  (336) 832-4440 Hours of Operation:  9 am - 6 pm, M-F.  Also accepts Medicaid/Medicare and self-pay.  °[facility] ° [address], [place] Phone: (336) 832-3150, Fax: (336) 832-3151. Hours of Operation:  8:30 am - 5:30 pm, M-F.  Also accepts Medicaid and self-pay.  °[facility] [place] [address], [place] Phone: (336) 878-6027   °[facility] [address], [place], [place] (336)723-1848, Ext. 123 Mondays & Thursdays: 7-9 AM.  First 15 patients are seen on a first come, first serve basis. °  ° °Medicaid-accepting [place] Providers: ° °Organization         Address  Phone   Notes  °[facility] [address], [place] (336) 641-2100 Also accepts self-pay patients.  °[facility] [address], [place] ° (336) 856-9996   °[facility] [address], [place]  (336) 288-8857   °[facility] [address] [place] [address], [place] (336) 299-7000   °Veita Bland [address], [place]  ° (336) 373-1557 Only accepts [place] Access Medicaid patients after they have their name applied to their card.  ° °Self-Pay (no insurance) in [place]: ° °Organization         Address  Phone   Notes  °Sickle Cell Patients, [facility] [address], [place] (336) 832-1970   °[facility] [address], [place] (336) 832-4400   °[facility] ° [address], [place] (336) 992-4800   °[facility]/Dr. Osei-Bonsu ° [address] [place] [address], [place] or [address], [place] (336) 841-8500 Phone number for both [place] and [place] locations is the same.  °[facility] [address], [place] (336) 299-0000   °[facility] [address] [place] [address], [place] or [address] (336) 852-7530 °(336) 878-2260   °[facility] [address], [place] (336) 350-1642, phone; (336) 294-5005, fax Sees patients 1st and 3rd Saturday of every month.  Must not qualify for public or private insurance (i.e. Medicaid, Medicare, [place] Health Choice, Veterans' Benefits) • Household income should be no more than 200% of the poverty level •The clinic cannot treat you if you are pregnant or think you are pregnant • Sexually transmitted diseases are not treated at the clinic.  ° ° °Dental Care: °Organization         Address  Phone  Notes  °[place] [facility] [address], [place] (336) 641-6152 Accepts children up to age 21 who are enrolled in Medicaid or [place] Health Choice; pregnant women with a Medicaid card; and children who have applied for Medicaid or [place] Health Choice, but were declined, whose parents can pay a reduced fee at time of service.  °[place]  [facility] [place]  [address], [place] (336) 641-7733 Accepts children up to age 21 who are enrolled in Medicaid or [place] Health Choice; pregnant women with a Medicaid card; and children who have applied for Medicaid or [place] Health Choice, but were declined, whose parents can pay a reduced fee at time of service.  °[facility] ° [address],  (336) 641-4533 Patients are seen by appointment only. Walk-ins are not accepted. [facility] will see patients 18 years of age and older. °Monday - Tuesday (8am-5pm) °Most Wednesdays (8:30-5pm) °$30 per visit, cash only  °[facility] ° [address], [place] (336) 641-4533 Patients are seen by appointment only. Walk-ins are not accepted. [facility] will see patients 18 years of age and older. °One   Wednesday Evening (Monthly: Volunteer Based).  $30 per visit, cash only  °[facility]  (919) 537-3737 for adults; Children under age 4, call Graduate Pediatric Dentistry at (919) [phone]. Children aged 4-14, please call (919) [phone] to request a pediatric application. ° Dental services are provided in all areas of dental care including fillings, crowns and bridges, complete and partial dentures, implants, gum treatment, root canals, and extractions. Preventive care is also provided. Treatment is provided to both adults and children. °Patients are selected via a lottery and there is often a waiting list. °  °[facility] [address], °[place] ° (336) 763-8833 [email or web address] °  °[facility] [address], [place], [place] (336)723-1848, Ext. 123 Second and Fourth Thursday of each month, opens at 6:30 AM; Clinic ends at 9 AM.  Patients are seen on a first-come first-served basis, and a limited number are seen during each clinic.  ° °[facility] ° [address], [place], [place] (336) 723-7904    Eligibility Requirements °You must have lived in [place], [place], or [place] counties for at least the last three months. °  You cannot be eligible for state or federal sponsored healthcare insurance, including Veterans Administration, Medicaid, or Medicare. °  You generally cannot be eligible for healthcare insurance through your employer.  °  How to apply: °Eligibility screenings are held every Tuesday and Wednesday afternoon from 1:00 pm until 4:00 pm. You do not need an appointment for the interview!  °[facility] [address], [place], [place] 336-631-2330   °[place] [facility]  336-342-8273   °[place] [facility]  336-703-3100   °[facility]  336-570-6415   ° °Behavioral Health Resources in the Community: °Intensive Outpatient Programs °Organization         Address  Phone  Notes  °[facility] [address], [place], [place] 336-878-6098   °[facility] [address], [place], [place] 336-832-9800   °[facility] [address], [place], [place] ° 336-882-2125   °[facility] [address],  °[place], [place] 1-800-853-5163 or 336-641-4981   °Substance Abuse Resources °Organization         Address  Phone  Notes  °[facility]  336-882-2125   °[facility]  336-784-9470   °[facility]  336-285-9073   °[facility]  336-845-3988   °Residential & Outpatient Substance Abuse Program  1-800-659-3381   °Psychological Services °Organization         Address  Phone  Notes  °[facility]  336- 832-9600   °[facility]  336- 378-7881   °[facility] [address], [place] 1-800-853-5163 or 336-641-4981   ° °Mobile Crisis Teams °Organization         Address  Phone  Notes  °[facility], Mobile Crisis Care Unit  1-877-626-1772   °Assertive °[facility] ° [address]  [place], [place] 336-834-9664   °Sharon DeEsch [address] °[place] 336-554-5454   ° °Self-Help/Support Groups °Organization         Address  Phone             Notes  °[facility]  - variety of support groups  336- 373-1402 Call for more information  °Narcotics Anonymous (NA), [facility] [address], °[place]   2 meetings at this location  ° °  Residential Treatment Programs °Organization         Address  Phone  Notes  °[facility] [address],    °[place]  1-866-801-8205   °[facility] ° [address], [place], [place] 704-293-8524   °[facility] [address], [place] 336-845-3988 Admissions: 8am-3pm M-F  °[facility] [address]    °[place], [place] 336-841-1104   °[facility] [address], [place], [place] 336-379-7146   °[facility] [address]  °[place], [place] 336-285-9073   °[facility] Intensive Outpatient [address], [place], [place] 336-852-3033   °[facility] [address]  °[place], [place] 1-877-615-2722 or 336-784-9470   °[facility] [address] [place], [place] 336-227-7417 Accepts Medicaid  °[facility] [address]  °[place] 1-800-659-3381 Substance Abuse/Addiction Treatment  ° °[place] Behavioral Health Resources °Organization         Address  Phone  Notes  °[facility]  (888) 581-9988   °Julie Brannon, PhD [address] [place], [place]   (336) 349-5553 or (336) 951-0000   °[facility]   [address] °[place], [place] (336) 349-4454   °[facility] [address], [place], [place] (336) 342-8316 Insurance/Medicaid/sponsorship through [facility]  °[facility] [address]                                    [place], [place] (336) 342-8316 Therapy/tele-psych/case    °[facility] [address]  ° [place], [place] (336) 349-2233    °Dr. Arfeen  (336) 349-4544   °[facility] [place]  [employer] [facility] [place] [facility] 1) [address], [place] °2) [address], [place] °3)  [address], [place] (336) 349-3220 °(336) 342-7768 ° °(336) 342-8140   °[place] [facility] (336) 342-1394 or (336) 342-3537 (After Hours)    ° ° °

## 2014-05-26 NOTE — ED Provider Notes (Signed)
CSN: [id number]     Arrival date & time 05/26/14  1037 History   First MD Initiated Contact with Patient 05/26/14 1043     Chief Complaint  Patient presents with  . Anxiety  . Nausea  . Diarrhea     (Consider location/radiation/quality/duration/timing/severity/associated sxs/prior Treatment) HPI  53yM with b/l wrist and R CP. Onset last night and significantly worse this morning. Was helping family member move yesterday and was lifting heavy furniture among other things. No swelling. Wrist pain and CP worse with movement. Better at rest. No fever or chills. No swelling. No acute numbness or tingling. No intervention prior to arrival.   Past Medical History  Diagnosis Date  . Hypertension   . Diabetes mellitus without complication    Past Surgical History  Procedure Laterality Date  . Hysteroscopy w/d&c N/A 06/11/2013    Procedure: DILATATION AND CURETTAGE /HYSTEROSCOPY/myomyectomy;  Surgeon: Emily Filbert, MD;  Location: [facility];  Service: Gynecology;  Laterality: N/A;   History reviewed. No pertinent family history. History  Substance Use Topics  . Smoking status: Former Smoker -- 0.25 packs/day for 20 years    Types: Cigarettes  . Smokeless tobacco: Not on file  . Alcohol Use: No   OB History   Grav Para Term Preterm Abortions TAB SAB Ect Mult Living   0              Review of Systems  All systems reviewed and negative, other than as noted in HPI.   Allergies  Latex and Penicillins  Home Medications   Prior to Admission medications   Medication Sig Start Date End Date Taking? Authorizing Provider  amLODipine (NORVASC) 10 MG tablet Take 10 mg by mouth daily.   Yes Historical Provider, MD  atenolol (TENORMIN) 50 MG tablet Take 50 mg by mouth daily.   Yes Historical Provider, MD  ferrous sulfate 325 (65 FE) MG tablet Take 325 mg by mouth daily with breakfast.   Yes Historical Provider, MD  gabapentin (NEURONTIN) 300 MG capsule Take 1,200 mg by mouth daily.   Yes  Historical Provider, MD  glipiZIDE (GLUCOTROL) 10 MG tablet Take 10 mg by mouth 3 (three) times daily.   Yes Historical Provider, MD  hydrochlorothiazide (HYDRODIURIL) 25 MG tablet Take 25 mg by mouth daily.   Yes Historical Provider, MD  metFORMIN (GLUCOPHAGE) 850 MG tablet Take 850 mg by mouth 2 (two) times daily with a meal.   Yes Historical Provider, MD  permethrin (ELIMITE) 5 % cream Apply 1 application topically once.   Yes Historical Provider, MD   BP 122/86  Pulse 109  Temp(Src) 98 F (36.7 C) (Oral)  Resp 14  SpO2 100% Physical Exam  Nursing note and vitals reviewed. Constitutional: She appears well-developed and well-nourished. No distress.  HENT:  Head: Normocephalic and atraumatic.  Eyes: Conjunctivae are normal. Right eye exhibits no discharge. Left eye exhibits no discharge.  Neck: Neck supple.  Cardiovascular: Regular rhythm and normal heart sounds.  Exam reveals no gallop and no friction rub.   No murmur heard. tachycardic  Pulmonary/Chest: Effort normal and breath sounds normal. No respiratory distress.  Abdominal: Soft. She exhibits no distension. There is no tenderness.  Genitourinary:  No cva tenderness  Musculoskeletal: She exhibits no edema and no tenderness.  Neurological: She is alert.  Skin: Skin is warm and dry.  Psychiatric: Thought content normal.  anxious    ED Course  Procedures (including critical care time) Labs Review Labs Reviewed  BASIC METABOLIC  PANEL - Abnormal; Notable for the following:    Potassium 3.2 (*)    Chloride 92 (*)    CO2 15 (*)    Glucose, Bld 239 (*)    Calcium 11.0 (*)    GFR calc non Af Amer 69 (*)    GFR calc Af Amer 80 (*)    Anion gap 30 (*)    All other components within normal limits  CBC WITH DIFFERENTIAL - Abnormal; Notable for the following:    RBC 5.27 (*)    Hemoglobin 15.4 (*)    Platelets 515 (*)    All other components within normal limits  URINALYSIS, ROUTINE W REFLEX MICROSCOPIC - Abnormal;  Notable for the following:    APPearance CLOUDY (*)    Hgb urine dipstick TRACE (*)    Ketones, ur >80 (*)    Protein, ur 100 (*)    All other components within normal limits  URINE MICROSCOPIC-ADD ON - Abnormal; Notable for the following:    Casts HYALINE CASTS (*)    All other components within normal limits  LACTIC ACID, PLASMA    Imaging Review No results found.   EKG Interpretation   Date/Time:  Wednesday May 26 2014 10:46:05 EDT Ventricular Rate:  124 PR Interval:  133 QRS Duration: 83 QT Interval:  343 QTC Calculation: 493 R Axis:   81 Text Interpretation:  Sinus tachycardia Nonspecific repol abnormality,  diffuse leads Confirmed by Wilson Singer  MD, Jasime Westergren ([id number]) on 05/26/2014 10:52:31  AM      MDM   Final diagnoses:  Non-intractable vomiting with nausea, vomiting of unspecified type  Diarrhea  Electrolyte and fluid disorder    53yF with n/v/d. Electrolyte abnormalities/metabolic derangement. Discussed admission with her. She would like to avoid it. She is agreeable to staying for additional IVF though. She is afebrile. Looks much better than on presentation. HR improved. Lactic acid normal. No additional vomiting/diarrhea while in ED. I think discharge is reasonable. Return precautions were stressed.     Virgel Manifold, MD 05/27/14 [phone]

## 2014-05-26 NOTE — ED Notes (Signed)
Bed: JK93 Expected date:  Expected time:  Means of arrival:  Comments: ems- anxiety, n/v/d

## 2014-05-26 NOTE — ED Notes (Signed)
MD ok for patient to have oral fluid. Tolerating well.

## 2014-05-26 NOTE — Progress Notes (Signed)
  CARE MANAGEMENT ED NOTE 05/26/2014  Patient:  [facility]   Account Number:  [id number]  Date Initiated:  05/26/2014  Documentation initiated by:  Jackelyn Poling  Subjective/Objective Assessment:   53 yr old [place] access c/o  N, V x2 days. Diarrhea this morning. Having anxiety attack upon EMS arrival. BP 140/80 HR 130 CBG 239. 24 G PIV left hand. 4mg  Zofran given. Hx anemia, DM, anxiety, HTN. ST on monitor.     Subjective/Objective Assessment Detail:   pcp listed in e medicaid response hx as [facility]     Action/Plan:   Updated EPIC   Action/Plan Detail:   Anticipated DC Date:       Status Recommendation to Physician:   Result of Recommendation:    Other ED [place]  Other  PCP issues  Outpatient Services - Pt will follow up    Choice offered to / List presented to:            Status of service:  Completed, signed off  ED Comments:   ED Comments Detail:

## 2014-05-26 NOTE — ED Notes (Signed)
CBG 177 mg/dl. MD aware. Will cancel insulin order.

## 2014-05-26 NOTE — ED Notes (Signed)
Per EMS- N, V x2 days. Diarrhea this morning. Having anxiety attack upon EMS arrival. BP 140/80 HR 130 CBG 239. 24 G PIV left hand. 4mg  Zofran given. Hx anemia, DM, anxiety, HTN. ST on monitor.

## 2014-05-26 NOTE — ED Notes (Addendum)
Initial contact- A&O x4. Able to move all extremities. Patient still having anxiety attack-has had similar attack in the past and says "this feels the same as last time." placed on 2L O2 [place] and encouraged to deep breathe. Pt is currently going through menopause and last menstrual cycle was last year. Is also being treated for scabies "picked up from sitting somewhere." Reports no sexual activity for past 3 years. One occurrence diarrhea today, denies blood. Pt not currently taking estrogen therapy or sleep medication.  Awaiting MD.

## 2014-05-28 ENCOUNTER — Encounter (HOSPITAL_COMMUNITY): Payer: Self-pay | Admitting: Emergency Medicine

## 2014-05-28 ENCOUNTER — Emergency Department (HOSPITAL_COMMUNITY)
Admission: EM | Admit: 2014-05-28 | Discharge: 2014-05-28 | Disposition: A | Discharge status: Home / Self Care | Payer: Medicaid Other | Attending: Emergency Medicine | Admitting: Emergency Medicine

## 2014-05-28 DIAGNOSIS — F411 Generalized anxiety disorder: Secondary | ICD-10-CM | POA: Diagnosis not present

## 2014-05-28 DIAGNOSIS — Z3202 Encounter for pregnancy test, result negative: Secondary | ICD-10-CM | POA: Insufficient documentation

## 2014-05-28 DIAGNOSIS — R000 Tachycardia, unspecified: Secondary | ICD-10-CM | POA: Insufficient documentation

## 2014-05-28 DIAGNOSIS — Z91040 Latex allergy status: Secondary | ICD-10-CM | POA: Diagnosis not present

## 2014-05-28 DIAGNOSIS — E119 Type 2 diabetes mellitus without complications: Secondary | ICD-10-CM | POA: Insufficient documentation

## 2014-05-28 DIAGNOSIS — B86 Scabies: Secondary | ICD-10-CM | POA: Diagnosis not present

## 2014-05-28 DIAGNOSIS — Z880 Allergy status to penicillin: Secondary | ICD-10-CM | POA: Insufficient documentation

## 2014-05-28 DIAGNOSIS — I10 Essential (primary) hypertension: Secondary | ICD-10-CM | POA: Insufficient documentation

## 2014-05-28 DIAGNOSIS — Z87891 Personal history of nicotine dependence: Secondary | ICD-10-CM | POA: Diagnosis not present

## 2014-05-28 DIAGNOSIS — R112 Nausea with vomiting, unspecified: Secondary | ICD-10-CM | POA: Diagnosis not present

## 2014-05-28 DIAGNOSIS — Z79899 Other long term (current) drug therapy: Secondary | ICD-10-CM | POA: Insufficient documentation

## 2014-05-28 DIAGNOSIS — B354 Tinea corporis: Secondary | ICD-10-CM | POA: Diagnosis not present

## 2014-05-28 LAB — CBC WITH DIFFERENTIAL/PLATELET
Basophils Absolute: 0 10*3/uL (ref 0.0–0.1)
Basophils Relative: 0 % (ref 0–1)
Eosinophils Absolute: 0.1 10*3/uL (ref 0.0–0.7)
Eosinophils Relative: 1 % (ref 0–5)
HCT: 40.5 % (ref 36.0–46.0)
HEMOGLOBIN: 14.3 g/dL (ref 12.0–15.0)
LYMPHS PCT: 46 % (ref 12–46)
Lymphs Abs: 4.1 10*3/uL — ABNORMAL HIGH (ref 0.7–4.0)
MCH: 29.2 pg (ref 26.0–34.0)
MCHC: 35.3 g/dL (ref 30.0–36.0)
MCV: 82.7 fL (ref 78.0–100.0)
MONOS PCT: 7 % (ref 3–12)
Monocytes Absolute: 0.7 10*3/uL (ref 0.1–1.0)
NEUTROS ABS: 4.1 10*3/uL (ref 1.7–7.7)
NEUTROS PCT: 46 % (ref 43–77)
PLATELETS: 460 10*3/uL — AB (ref 150–400)
RBC: 4.9 MIL/uL (ref 3.87–5.11)
RDW: 12.8 % (ref 11.5–15.5)
WBC: 9 10*3/uL (ref 4.0–10.5)

## 2014-05-28 LAB — URINALYSIS, ROUTINE W REFLEX MICROSCOPIC
Bilirubin Urine: NEGATIVE
Glucose, UA: NEGATIVE mg/dL
Hgb urine dipstick: NEGATIVE
KETONES UR: NEGATIVE mg/dL
Nitrite: NEGATIVE
Protein, ur: NEGATIVE mg/dL
Specific Gravity, Urine: 1.008 (ref 1.005–1.030)
UROBILINOGEN UA: 0.2 mg/dL (ref 0.0–1.0)
pH: 7.5 (ref 5.0–8.0)

## 2014-05-28 LAB — COMPREHENSIVE METABOLIC PANEL
ALT: 18 U/L (ref 0–35)
AST: 27 U/L (ref 0–37)
Albumin: 4.5 g/dL (ref 3.5–5.2)
Alkaline Phosphatase: 117 U/L (ref 39–117)
Anion gap: 23 — ABNORMAL HIGH (ref 5–15)
BUN: 9 mg/dL (ref 6–23)
CALCIUM: 10.8 mg/dL — AB (ref 8.4–10.5)
CO2: 18 mEq/L — ABNORMAL LOW (ref 19–32)
CREATININE: 0.7 mg/dL (ref 0.50–1.10)
Chloride: 98 mEq/L (ref 96–112)
GFR calc Af Amer: >90 mL/min (ref >90)
GFR calc non Af Amer: >90 mL/min (ref >90)
GLUCOSE: 192 mg/dL — AB (ref 70–99)
Potassium: 4 mEq/L (ref 3.7–5.3)
SODIUM: 139 meq/L (ref 137–147)
Total Bilirubin: 0.2 mg/dL — ABNORMAL LOW (ref 0.3–1.2)
Total Protein: 8.8 g/dL — ABNORMAL HIGH (ref 6.0–8.3)

## 2014-05-28 LAB — POC URINE PREG, ED: Preg Test, Ur: NEGATIVE

## 2014-05-28 LAB — CBG MONITORING, ED: Glucose-Capillary: 198 mg/dL — ABNORMAL HIGH (ref 70–99)

## 2014-05-28 LAB — URINE MICROSCOPIC-ADD ON

## 2014-05-28 LAB — LIPASE, BLOOD: LIPASE: 75 U/L — AB (ref 11–59)

## 2014-05-28 MED ORDER — CLOTRIMAZOLE 1 % EX CREA: TOPICAL_CREAM | CUTANEOUS | Status: DC

## 2014-05-28 MED ORDER — PROMETHAZINE HCL 25 MG PO TABS: 25.0000 mg | ORAL_TABLET | Freq: Four times a day (QID) | ORAL | Status: DC | PRN

## 2014-05-28 MED ORDER — SODIUM CHLORIDE 0.9 % IV BOLUS (SEPSIS)
1000.0000 mL | Freq: Once | INTRAVENOUS | Status: AC
  Administered 2014-05-28: 1000 mL via INTRAVENOUS

## 2014-05-28 MED ORDER — IVERMECTIN 3 MG PO TABS: 200.0000 ug/kg | ORAL_TABLET | Freq: Once | ORAL | Status: DC

## 2014-05-28 MED ORDER — ONDANSETRON HCL 4 MG/2ML IJ SOLN
4.0000 mg | Freq: Once | INTRAMUSCULAR | Status: AC
  Administered 2014-05-28: 4 mg via INTRAVENOUS

## 2014-05-28 MED ORDER — ONDANSETRON HCL 4 MG/2ML IJ SOLN
4.0000 mg | Freq: Once | INTRAMUSCULAR | Status: AC
  Administered 2014-05-28: 4 mg via INTRAVENOUS
  Filled 2014-05-28: qty 2

## 2014-05-28 NOTE — ED Notes (Signed)
Ginger ale given. Pt up at bedside eating. States that she feels better. Denies pain.

## 2014-05-28 NOTE — ED Notes (Signed)
Per pt, states she was here for same symptoms on the 1st-states she is still vomiting

## 2014-05-28 NOTE — ED Provider Notes (Signed)
CSN: [id number]     Arrival date & time 05/28/14  1445 History   First MD Initiated Contact with Patient 05/28/14 1529     Chief Complaint  Patient presents with  . Emesis     (Consider location/radiation/quality/duration/timing/severity/associated sxs/prior Treatment) HPI Comments: Was seen 7/1 for n/v/d, which resolved and she was able to eat yesterday, but today she again has had no appetite and 3 episodes of hot flashes/chills, anxiety, n/v. On 7/1, pt has multiple e-lyte abnormalities.   Patient is a 53 y.o. female presenting with vomiting. The history is provided by the patient. No language interpreter was used.  Emesis Severity:  Moderate Duration:  3 days Timing:  Intermittent Number of daily episodes:  3 Emesis appearance: white. Onset of vomiting after eating: not related. Chronicity:  New Recent urination:  Normal Context: not post-tussive and not self-induced   Relieved by:  Nothing Worsened by:  Nothing tried Ineffective treatments:  None tried Associated symptoms: chills   Associated symptoms: no abdominal pain, no arthralgias, no cough, no diarrhea, no fever, no headaches, no myalgias, no sore throat and no URI   Associated symptoms comment:  Episodes of hot flashes, chills, anxiety, nausea, vomiting Risk factors: no sick contacts     Past Medical History  Diagnosis Date  . Hypertension   . Diabetes mellitus without complication    Past Surgical History  Procedure Laterality Date  . Hysteroscopy w/d&c N/A 06/11/2013    Procedure: DILATATION AND CURETTAGE /HYSTEROSCOPY/myomyectomy;  Surgeon: Emily Filbert, MD;  Location: [facility];  Service: Gynecology;  Laterality: N/A;   No family history on file. History  Substance Use Topics  . Smoking status: Former Smoker -- 0.25 packs/day for 20 years    Types: Cigarettes  . Smokeless tobacco: Not on file  . Alcohol Use: No   OB History   Grav Para Term Preterm Abortions TAB SAB Ect Mult Living   0               Review of Systems  Constitutional: Positive for chills. Negative for fever, diaphoresis, activity change, appetite change and fatigue.  HENT: Negative for congestion, facial swelling, rhinorrhea and sore throat.   Eyes: Negative for photophobia and discharge.  Respiratory: Negative for cough, chest tightness and shortness of breath.   Cardiovascular: Negative for chest pain, palpitations and leg swelling.  Gastrointestinal: Positive for vomiting. Negative for nausea, abdominal pain and diarrhea.  Endocrine: Negative for polydipsia and polyuria.  Genitourinary: Negative for dysuria, frequency, difficulty urinating and pelvic pain.  Musculoskeletal: Negative for arthralgias, back pain, myalgias, neck pain and neck stiffness.  Skin: Negative for color change and wound.  Allergic/Immunologic: Negative for immunocompromised state.  Neurological: Negative for facial asymmetry, weakness, numbness and headaches.  Hematological: Does not bruise/bleed easily.  Psychiatric/Behavioral: Negative for confusion and agitation.       Anxiety       Allergies  Latex and Penicillins  Home Medications   Prior to Admission medications   Medication Sig Start Date End Date Taking? Authorizing Provider  amLODipine (NORVASC) 10 MG tablet Take 10 mg by mouth daily.   Yes Historical Provider, MD  atenolol (TENORMIN) 50 MG tablet Take 50 mg by mouth daily.   Yes Historical Provider, MD  ferrous sulfate 325 (65 FE) MG tablet Take 325 mg by mouth daily with breakfast.   Yes Historical Provider, MD  gabapentin (NEURONTIN) 300 MG capsule Take 1,200 mg by mouth daily.   Yes Historical Provider, MD  glipiZIDE (  GLUCOTROL) 10 MG tablet Take 10 mg by mouth 3 (three) times daily.   Yes Historical Provider, MD  hydrochlorothiazide (HYDRODIURIL) 25 MG tablet Take 25 mg by mouth daily.   Yes Historical Provider, MD  metFORMIN (GLUCOPHAGE) 850 MG tablet Take 850 mg by mouth 2 (two) times daily with a meal.   Yes  Historical Provider, MD  permethrin (ELIMITE) 5 % cream Apply 1 application topically once.   Yes Historical Provider, MD  clotrimazole (LOTRIMIN) 1 % cream Apply to affected area on right lower leg 2 times daily for 10 days 05/28/14   Neta Ehlers, MD  ivermectin (STROMECTOL) 3 MG TABS tablet Take 5 tablets (15,000 mcg total) by mouth once. 05/28/14   Neta Ehlers, MD  promethazine (PHENERGAN) 25 MG tablet Take 1 tablet (25 mg total) by mouth every 6 (six) hours as needed for nausea or vomiting. 05/28/14   Neta Ehlers, MD   BP 146/86  Pulse 96  Temp(Src) 98.3 F (36.8 C) (Oral)  Resp 20  SpO2 100% Physical Exam  Constitutional: She is oriented to person, place, and time. She appears well-developed and well-nourished. No distress.  HENT:  Head: Normocephalic and atraumatic.  Mouth/Throat: No oropharyngeal exudate.  Eyes: Pupils are equal, round, and reactive to light.  Neck: Normal range of motion. Neck supple.  Cardiovascular: Regular rhythm and normal heart sounds.  Tachycardia present.  Exam reveals no gallop and no friction rub.   No murmur heard. Pulmonary/Chest: Effort normal and breath sounds normal. No respiratory distress. She has no wheezes. She has no rales.  Abdominal: Soft. Bowel sounds are normal. She exhibits no distension and no mass. There is no tenderness. There is no rebound and no guarding.  Musculoskeletal: Normal range of motion. She exhibits no edema and no tenderness.  Neurological: She is alert and oriented to person, place, and time.  Skin: Skin is warm and dry.  Generalized papules/plaques with overlying scales/excoriation on trunk, hands, and BLLE. Also on LE, she has several erythematous ring-shaped raised lesions.   Psychiatric: She has a normal mood and affect.    ED Course  Procedures (including critical care time) Labs Review Labs Reviewed  URINALYSIS, ROUTINE W REFLEX MICROSCOPIC - Abnormal; Notable for the following:    APPearance CLOUDY  (*)    Leukocytes, UA MODERATE (*)    All other components within normal limits  LIPASE, BLOOD - Abnormal; Notable for the following:    Lipase 75 (*)    All other components within normal limits  COMPREHENSIVE METABOLIC PANEL - Abnormal; Notable for the following:    CO2 18 (*)    Glucose, Bld 192 (*)    Calcium 10.8 (*)    Total Protein 8.8 (*)    Total Bilirubin 0.2 (*)    Anion gap 23 (*)    All other components within normal limits  CBC WITH DIFFERENTIAL - Abnormal; Notable for the following:    Platelets 460 (*)    Lymphs Abs 4.1 (*)    All other components within normal limits  URINE MICROSCOPIC-ADD ON - Abnormal; Notable for the following:    Squamous Epithelial / LPF FEW (*)    Bacteria, UA FEW (*)    All other components within normal limits  CBG MONITORING, ED - Abnormal; Notable for the following:    Glucose-Capillary 198 (*)    All other components within normal limits  I-STAT CG4 LACTIC ACID, ED  POC URINE PREG, ED    Imaging  Review No results found.   EKG Interpretation None      MDM   Final diagnoses:  Non-intractable vomiting with nausea, vomiting of unspecified type  Crusted scabies  Tinea corporis    Pt is a 53 y.o. female with Pmhx as above who presents with episodic hot flashes/chills, with anxiety, n/v.  Was seen for same on 7/1 with assoc d/a. D/a has not returned, but had 3 episodes as above this morning. She denies CP, AB pain, SOB except prior to vomiting. She is has no complaints currently except for about 3 weeks of a continuously pruritis rash that has been diagnosed as scabies and has been treated w/ Elimite 3 times. On PE, she is mily tachycardic, VS otherwise stable in NAD. She has generalized excoriated rash of trunk, hands, and legs.  There is secondary ring-like rash on legs more c/w tinea corporis than scabies  7:10 PM E-lyte disturbances from 7/1 improved. Mild lipase elevation likely due to emesis given benign abdominal exam. She  has tolerate PO in dept. Will given Rx for ivermectin given tx failure w/ elemite cream, as well as antifungal cream for legs and phenergan for n/v.  Return precautions given for new or worsening symptoms including abdominal pain, fever, inability to tolerate PO.         Neta Ehlers, MD 05/28/14 1910

## 2014-05-28 NOTE — Discharge Instructions (Signed)
Nausea and Vomiting °Nausea is a sick feeling that often comes before throwing up (vomiting). Vomiting is a reflex where stomach contents come out of your mouth. Vomiting can cause severe loss of body fluids (dehydration). Children and elderly adults can become dehydrated quickly, especially if they also have diarrhea. Nausea and vomiting are symptoms of a condition or disease. It is important to find the cause of your symptoms. °CAUSES  °· Direct irritation of the stomach lining. This irritation can result from increased acid production (gastroesophageal reflux disease), infection, food poisoning, taking certain medicines (such as nonsteroidal anti-inflammatory drugs), alcohol use, or tobacco use. °· Signals from the brain. These signals could be caused by a headache, heat exposure, an inner ear disturbance, increased pressure in the brain from injury, infection, a tumor, or a concussion, pain, emotional stimulus, or metabolic problems. °· An obstruction in the gastrointestinal tract (bowel obstruction). °· Illnesses such as diabetes, hepatitis, gallbladder problems, appendicitis, kidney problems, cancer, sepsis, atypical symptoms of a heart attack, or eating disorders. °· Medical treatments such as chemotherapy and radiation. °· Receiving medicine that makes you sleep (general anesthetic) during surgery. °DIAGNOSIS °Your caregiver may ask for tests to be done if the problems do not improve after a few days. Tests may also be done if symptoms are severe or if the reason for the nausea and vomiting is not clear. Tests may include: °· Urine tests. °· Blood tests. °· Stool tests. °· Cultures (to look for evidence of infection). °· X-rays or other imaging studies. °Test results can help your caregiver make decisions about treatment or the need for additional tests. °TREATMENT °You need to stay well hydrated. Drink frequently but in small amounts. You may wish to drink water, sports drinks, clear broth, or eat frozen  ice pops or gelatin dessert to help stay hydrated. When you eat, eating slowly may help prevent nausea. There are also some antinausea medicines that may help prevent nausea. °HOME CARE INSTRUCTIONS  °· Take all medicine as directed by your caregiver. °· If you do not have an appetite, do not force yourself to eat. However, you must continue to drink fluids. °· If you have an appetite, eat a normal diet unless your caregiver tells you differently. °¨ Eat a variety of complex carbohydrates (rice, wheat, potatoes, bread), lean meats, yogurt, fruits, and vegetables. °¨ Avoid high-fat foods because they are more difficult to digest. °· Drink enough water and fluids to keep your urine clear or pale yellow. °· If you are dehydrated, ask your caregiver for specific rehydration instructions. Signs of dehydration may include: °¨ Severe thirst. °¨ Dry lips and mouth. °¨ Dizziness. °¨ Dark urine. °¨ Decreasing urine frequency and amount. °¨ Confusion. °¨ Rapid breathing or pulse. °SEEK IMMEDIATE MEDICAL CARE IF:  °· You have blood or brown flecks (like coffee grounds) in your vomit. °· You have black or bloody stools. °· You have a severe headache or stiff neck. °· You are confused. °· You have severe abdominal pain. °· You have chest pain or trouble breathing. °· You do not urinate at least once every 8 hours. °· You develop cold or clammy skin. °· You continue to vomit for longer than 24 to 48 hours. °· You have a fever. °MAKE SURE YOU:  °· Understand these instructions. °· Will watch your condition. °· Will get help right away if you are not doing well or get worse. °Document Released: 11/12/2005 Document Revised: 02/04/2012 Document Reviewed: 04/11/2011 °[employer]® Patient Information ©2015 [employer]. This information is not intended   to replace advice given to you by your health care provider. Make sure you discuss any questions you have with your health care provider.   Scabies Scabies are small bugs (mites) that  burrow under the skin and cause red bumps and severe itching. These bugs can only be seen with a microscope. Scabies are highly contagious. They can spread easily from person to person by direct contact. They are also spread through sharing clothing or linens that have the scabies mites living in them. It is not unusual for an entire family to become infected through shared towels, clothing, or bedding.  HOME CARE INSTRUCTIONS   Your caregiver may prescribe a cream or lotion to kill the mites. If cream is prescribed, massage the cream into the entire body from the neck to the bottom of both feet. Also massage the cream into the scalp and face if your child is less than 33 year old. Avoid the eyes and mouth. Do not wash your hands after application.  Leave the cream on for 8 to 12 hours. Your child should bathe or shower after the 8 to 12 hour application period. Sometimes it is helpful to apply the cream to your child right before bedtime.  One treatment is usually effective and will eliminate approximately 95% of infestations. For severe cases, your caregiver may decide to repeat the treatment in 1 week. Everyone in your household should be treated with one application of the cream.  New rashes or burrows should not appear within 24 to 48 hours after successful treatment. However, the itching and rash may last for 2 to 4 weeks after successful treatment. Your caregiver may prescribe a medicine to help with the itching or to help the rash go away more quickly.  Scabies can live on clothing or linens for up to 3 days. All of your child's recently used clothing, towels, stuffed toys, and bed linens should be washed in hot water and then dried in a dryer for at least 20 minutes on high heat. Items that cannot be washed should be enclosed in a plastic bag for at least 3 days.  To help relieve itching, bathe your child in a cool bath or apply cool washcloths to the affected areas.  Your child may return to  school after treatment with the prescribed cream. SEEK MEDICAL CARE IF:   The itching persists longer than 4 weeks after treatment.  The rash spreads or becomes infected. Signs of infection include red blisters or yellow-tan crust. Document Released: 11/12/2005 Document Revised: 02/04/2012 Document Reviewed: 03/23/2009 [facility] Patient Information 2015 [place], [place]. This information is not intended to replace advice given to you by your health care provider. Make sure you discuss any questions you have with your health care provider.  Body Ringworm Ringworm (tinea corporis) is a fungal infection of the skin on the body. This infection is not caused by worms, but is actually caused by a fungus. Fungus normally lives on the top of your skin and can be useful. However, in the case of ringworms, the fungus grows out of control and causes a skin infection. It can involve any area of skin on the body and can spread easily from one person to another (contagious). Ringworm is a common problem for children, but it can affect adults as well. Ringworm is also often found in athletes, especially wrestlers who share equipment and mats.  CAUSES  Ringworm of the body is caused by a fungus called dermatophyte. It can spread by:  Touchingother  people who are infected.  Touchinginfected pets.  Touching or sharingobjects that have been in contact with the infected person or pet (hats, combs, towels, clothing, sports equipment). SYMPTOMS   Itchy, raised red spots and bumps on the skin.  Ring-shaped rash.  Redness near the border of the rash with a clear center.  Dry and scaly skin on or around the rash. Not every person develops a ring-shaped rash. Some develop only the red, scaly patches. DIAGNOSIS  Most often, ringworm can be diagnosed by performing a skin exam. Your caregiver may choose to take a skin scraping from the affected area. The sample will be examined under the microscope to see if the  fungus is present.  TREATMENT  Body ringworm may be treated with a topical antifungal cream or ointment. Sometimes, an antifungal shampoo that can be used on your body is prescribed. You may be prescribed antifungal medicines to take by mouth if your ringworm is severe, keeps coming back, or lasts a long time.  HOME CARE INSTRUCTIONS   Only take over-the-counter or prescription medicines as directed by your caregiver.  Wash the infected area and dry it completely before applying yourcream or ointment.  When using antifungal shampoo to treat the ringworm, leave the shampoo on the body for 3-5 minutes before rinsing.   Wear loose clothing to stop clothes from rubbing and irritating the rash.  Wash or change your bed sheets every night while you have the rash.  Have your pet treated by your veterinarian if it has the same infection. To prevent ringworm:   Practice good hygiene.  Wear sandals or shoes in public places and showers.  Do not share personal items with others.  Avoid touching red patches of skin on other people.  Avoid touching pets that have bald spots or wash your hands after doing so. SEEK MEDICAL CARE IF:   Your rash continues to spread after 7 days of treatment.  Your rash is not gone in 4 weeks.  The area around your rash becomes red, warm, tender, and swollen. Document Released: 11/09/2000 Document Revised: 08/06/2012 Document Reviewed: 05/26/2012 [facility] Patient Information 2015 [place], [place]. This information is not intended to replace advice given to you by your health care provider. Make sure you discuss any questions you have with your health care provider.

## 2014-10-20 ENCOUNTER — Inpatient Hospital Stay (HOSPITAL_COMMUNITY)
Admission: EM | Admit: 2014-10-20 | Discharge: 2014-10-21 | DRG: 638 | Disposition: A | Discharge status: Home / Self Care | Payer: Medicaid Other | Attending: Internal Medicine | Admitting: Internal Medicine

## 2014-10-20 ENCOUNTER — Emergency Department (HOSPITAL_COMMUNITY): Payer: Medicaid Other

## 2014-10-20 ENCOUNTER — Encounter (HOSPITAL_COMMUNITY): Payer: Self-pay | Admitting: *Deleted

## 2014-10-20 DIAGNOSIS — E872 Acidosis: Secondary | ICD-10-CM | POA: Diagnosis present

## 2014-10-20 DIAGNOSIS — Z90710 Acquired absence of both cervix and uterus: Secondary | ICD-10-CM | POA: Diagnosis not present

## 2014-10-20 DIAGNOSIS — F410 Panic disorder [episodic paroxysmal anxiety] without agoraphobia: Secondary | ICD-10-CM | POA: Diagnosis present

## 2014-10-20 DIAGNOSIS — Z880 Allergy status to penicillin: Secondary | ICD-10-CM | POA: Diagnosis not present

## 2014-10-20 DIAGNOSIS — E860 Dehydration: Secondary | ICD-10-CM | POA: Diagnosis present

## 2014-10-20 DIAGNOSIS — Z87891 Personal history of nicotine dependence: Secondary | ICD-10-CM | POA: Diagnosis not present

## 2014-10-20 DIAGNOSIS — I10 Essential (primary) hypertension: Secondary | ICD-10-CM | POA: Diagnosis present

## 2014-10-20 DIAGNOSIS — E876 Hypokalemia: Secondary | ICD-10-CM | POA: Diagnosis present

## 2014-10-20 DIAGNOSIS — Z79899 Other long term (current) drug therapy: Secondary | ICD-10-CM | POA: Diagnosis not present

## 2014-10-20 DIAGNOSIS — Z91040 Latex allergy status: Secondary | ICD-10-CM

## 2014-10-20 DIAGNOSIS — E1110 Type 2 diabetes mellitus with ketoacidosis without coma: Secondary | ICD-10-CM | POA: Diagnosis present

## 2014-10-20 DIAGNOSIS — F419 Anxiety disorder, unspecified: Secondary | ICD-10-CM

## 2014-10-20 DIAGNOSIS — E1310 Other specified diabetes mellitus with ketoacidosis without coma: Secondary | ICD-10-CM | POA: Diagnosis present

## 2014-10-20 DIAGNOSIS — N390 Urinary tract infection, site not specified: Secondary | ICD-10-CM | POA: Diagnosis present

## 2014-10-20 DIAGNOSIS — E0810 Diabetes mellitus due to underlying condition with ketoacidosis without coma: Secondary | ICD-10-CM

## 2014-10-20 DIAGNOSIS — K910 Vomiting following gastrointestinal surgery: Secondary | ICD-10-CM | POA: Diagnosis present

## 2014-10-20 DIAGNOSIS — E8729 Other acidosis: Secondary | ICD-10-CM | POA: Insufficient documentation

## 2014-10-20 LAB — BASIC METABOLIC PANEL
Anion gap: 20 — ABNORMAL HIGH (ref 5–15)
BUN: 18 mg/dL (ref 6–23)
CALCIUM: 9.2 mg/dL (ref 8.4–10.5)
CHLORIDE: 96 meq/L (ref 96–112)
CO2: 20 mEq/L (ref 19–32)
CREATININE: 0.91 mg/dL (ref 0.50–1.10)
GFR calc non Af Amer: 71 mL/min — ABNORMAL LOW (ref >90)
GFR, EST AFRICAN AMERICAN: 82 mL/min — AB (ref >90)
Glucose, Bld: 262 mg/dL — ABNORMAL HIGH (ref 70–99)
Potassium: 3.4 mEq/L — ABNORMAL LOW (ref 3.7–5.3)
Sodium: 136 mEq/L — ABNORMAL LOW (ref 137–147)

## 2014-10-20 LAB — CBC WITH DIFFERENTIAL/PLATELET
BASOS PCT: 0 % (ref 0–1)
Basophils Absolute: 0 10*3/uL (ref 0.0–0.1)
EOS ABS: 0 10*3/uL (ref 0.0–0.7)
Eosinophils Relative: 1 % (ref 0–5)
HCT: 46.3 % — ABNORMAL HIGH (ref 36.0–46.0)
HEMOGLOBIN: 16.5 g/dL — AB (ref 12.0–15.0)
LYMPHS ABS: 3.2 10*3/uL (ref 0.7–4.0)
Lymphocytes Relative: 37 % (ref 12–46)
MCH: 29.3 pg (ref 26.0–34.0)
MCHC: 35.6 g/dL (ref 30.0–36.0)
MCV: 82.2 fL (ref 78.0–100.0)
MONOS PCT: 7 % (ref 3–12)
Monocytes Absolute: 0.6 10*3/uL (ref 0.1–1.0)
NEUTROS ABS: 4.8 10*3/uL (ref 1.7–7.7)
Neutrophils Relative %: 55 % (ref 43–77)
Platelets: 448 10*3/uL — ABNORMAL HIGH (ref 150–400)
RBC: 5.63 MIL/uL — AB (ref 3.87–5.11)
RDW: 13.1 % (ref 11.5–15.5)
WBC: 8.7 10*3/uL (ref 4.0–10.5)

## 2014-10-20 LAB — CBG MONITORING, ED: Glucose-Capillary: 256 mg/dL — ABNORMAL HIGH (ref 70–99)

## 2014-10-20 LAB — COMPREHENSIVE METABOLIC PANEL
ALBUMIN: 4.6 g/dL (ref 3.5–5.2)
ALT: 10 U/L (ref 0–35)
AST: 18 U/L (ref 0–37)
Alkaline Phosphatase: 110 U/L (ref 39–117)
Anion gap: 27 — ABNORMAL HIGH (ref 5–15)
BUN: 19 mg/dL (ref 6–23)
CHLORIDE: 90 meq/L — AB (ref 96–112)
CO2: 19 mEq/L (ref 19–32)
Calcium: 11 mg/dL — ABNORMAL HIGH (ref 8.4–10.5)
Creatinine, Ser: 1.03 mg/dL (ref 0.50–1.10)
GFR calc Af Amer: 71 mL/min — ABNORMAL LOW (ref >90)
GFR calc non Af Amer: 61 mL/min — ABNORMAL LOW (ref >90)
GLUCOSE: 267 mg/dL — AB (ref 70–99)
POTASSIUM: 3.9 meq/L (ref 3.7–5.3)
Sodium: 136 mEq/L — ABNORMAL LOW (ref 137–147)
TOTAL PROTEIN: 9.1 g/dL — AB (ref 6.0–8.3)
Total Bilirubin: 0.5 mg/dL (ref 0.3–1.2)

## 2014-10-20 LAB — RAPID URINE DRUG SCREEN, HOSP PERFORMED
AMPHETAMINES: NOT DETECTED
BARBITURATES: NOT DETECTED
BENZODIAZEPINES: NOT DETECTED
COCAINE: NOT DETECTED
OPIATES: NOT DETECTED
TETRAHYDROCANNABINOL: POSITIVE — AB

## 2014-10-20 LAB — URINE MICROSCOPIC-ADD ON

## 2014-10-20 LAB — LIPASE, BLOOD: Lipase: 54 U/L (ref 11–59)

## 2014-10-20 LAB — URINALYSIS, ROUTINE W REFLEX MICROSCOPIC
GLUCOSE, UA: 100 mg/dL — AB
Ketones, ur: >80 mg/dL — AB
Nitrite: NEGATIVE
PH: 6 (ref 5.0–8.0)
Protein, ur: 100 mg/dL — AB
SPECIFIC GRAVITY, URINE: 1.017 (ref 1.005–1.030)
Urobilinogen, UA: 1 mg/dL (ref 0.0–1.0)

## 2014-10-20 LAB — LACTIC ACID, PLASMA: Lactic Acid, Venous: 2 mmol/L (ref 0.5–2.2)

## 2014-10-20 LAB — TROPONIN I: Troponin I: <0.3 ng/mL (ref <0.30)

## 2014-10-20 LAB — D-DIMER, QUANTITATIVE: D-Dimer, Quant: 0.35 ug/mL-FEU (ref 0.00–0.48)

## 2014-10-20 MED ORDER — SODIUM CHLORIDE 0.9 % IV BOLUS (SEPSIS)
1000.0000 mL | Freq: Once | INTRAVENOUS | Status: AC
  Administered 2014-10-20: 1000 mL via INTRAVENOUS

## 2014-10-20 MED ORDER — HYDROCODONE-ACETAMINOPHEN 5-325 MG PO TABS
1.0000 | ORAL_TABLET | ORAL | Status: DC | PRN
  Administered 2014-10-20 – 2014-10-21 (×2): 1 via ORAL
  Filled 2014-10-20 (×2): qty 1

## 2014-10-20 MED ORDER — FERROUS SULFATE 325 (65 FE) MG PO TABS
325.0000 mg | ORAL_TABLET | Freq: Every day | ORAL | Status: DC
  Administered 2014-10-21: 325 mg via ORAL
  Filled 2014-10-20 (×2): qty 1

## 2014-10-20 MED ORDER — GABAPENTIN 400 MG PO CAPS
1600.0000 mg | ORAL_CAPSULE | Freq: Two times a day (BID) | ORAL | Status: DC
  Administered 2014-10-20 – 2014-10-21 (×2): 1600 mg via ORAL
  Filled 2014-10-20 (×3): qty 4

## 2014-10-20 MED ORDER — SODIUM CHLORIDE 0.9 % IV SOLN
INTRAVENOUS | Status: DC
  Filled 2014-10-20 (×2): qty 2.5

## 2014-10-20 MED ORDER — SODIUM CHLORIDE 0.9 % IV SOLN
INTRAVENOUS | Status: DC
  Administered 2014-10-20 – 2014-10-21 (×2): via INTRAVENOUS

## 2014-10-20 MED ORDER — GABAPENTIN 800 MG PO TABS
1600.0000 mg | ORAL_TABLET | Freq: Two times a day (BID) | ORAL | Status: DC
  Filled 2014-10-20: qty 2

## 2014-10-20 MED ORDER — DOCUSATE SODIUM 100 MG PO CAPS
100.0000 mg | ORAL_CAPSULE | Freq: Two times a day (BID) | ORAL | Status: DC
  Administered 2014-10-20 – 2014-10-21 (×2): 100 mg via ORAL
  Filled 2014-10-20 (×3): qty 1

## 2014-10-20 MED ORDER — ATENOLOL 50 MG PO TABS
50.0000 mg | ORAL_TABLET | Freq: Every day | ORAL | Status: DC
  Administered 2014-10-21: 50 mg via ORAL
  Filled 2014-10-20: qty 1

## 2014-10-20 MED ORDER — SODIUM CHLORIDE 0.9 % IV SOLN: 1000.0000 mL | INTRAVENOUS | Status: DC

## 2014-10-20 MED ORDER — INSULIN REGULAR BOLUS VIA INFUSION
0.0000 [IU] | Freq: Three times a day (TID) | INTRAVENOUS | Status: DC
  Administered 2014-10-21: 2.5 [IU] via INTRAVENOUS
  Filled 2014-10-20: qty 10

## 2014-10-20 MED ORDER — SODIUM CHLORIDE 0.9 % IV SOLN
INTRAVENOUS | Status: DC
  Administered 2014-10-20: 2.1 [IU]/h via INTRAVENOUS
  Administered 2014-10-21: 03:00:00 via INTRAVENOUS
  Filled 2014-10-20: qty 2.5

## 2014-10-20 MED ORDER — CEFTRIAXONE SODIUM 1 G IJ SOLR
1.0000 g | Freq: Once | INTRAMUSCULAR | Status: AC
  Administered 2014-10-20: 1 g via INTRAVENOUS
  Filled 2014-10-20: qty 10

## 2014-10-20 MED ORDER — ONDANSETRON HCL 4 MG/2ML IJ SOLN
4.0000 mg | Freq: Once | INTRAMUSCULAR | Status: AC
  Administered 2014-10-20: 4 mg via INTRAVENOUS
  Filled 2014-10-20: qty 2

## 2014-10-20 MED ORDER — RAMIPRIL 2.5 MG PO CAPS
2.5000 mg | ORAL_CAPSULE | Freq: Every day | ORAL | Status: DC
  Administered 2014-10-21: 2.5 mg via ORAL
  Filled 2014-10-20: qty 1

## 2014-10-20 MED ORDER — ASPIRIN EC 81 MG PO TBEC
81.0000 mg | DELAYED_RELEASE_TABLET | Freq: Every day | ORAL | Status: DC
  Administered 2014-10-21: 81 mg via ORAL
  Filled 2014-10-20: qty 1

## 2014-10-20 MED ORDER — LORAZEPAM 2 MG/ML IJ SOLN
1.0000 mg | Freq: Once | INTRAMUSCULAR | Status: AC
  Administered 2014-10-20: 1 mg via INTRAVENOUS
  Filled 2014-10-20: qty 1

## 2014-10-20 MED ORDER — ENOXAPARIN SODIUM 40 MG/0.4ML ~~LOC~~ SOLN
40.0000 mg | SUBCUTANEOUS | Status: DC
  Administered 2014-10-20: 40 mg via SUBCUTANEOUS
  Filled 2014-10-20 (×2): qty 0.4

## 2014-10-20 MED ORDER — POTASSIUM CHLORIDE CRYS ER 20 MEQ PO TBCR
40.0000 meq | EXTENDED_RELEASE_TABLET | Freq: Once | ORAL | Status: AC
  Administered 2014-10-20: 40 meq via ORAL
  Filled 2014-10-20: qty 2

## 2014-10-20 MED ORDER — DEXTROSE-NACL 5-0.45 % IV SOLN: INTRAVENOUS | Status: DC

## 2014-10-20 MED ORDER — SODIUM CHLORIDE 0.9 % IV SOLN: 1000.0000 mL | Freq: Once | INTRAVENOUS | Status: DC

## 2014-10-20 MED ORDER — SODIUM CHLORIDE 0.9 % IV SOLN
1000.0000 mL | Freq: Once | INTRAVENOUS | Status: AC
Start: 2014-10-20 — End: 2014-10-20
  Administered 2014-10-20: 1000 mL via INTRAVENOUS

## 2014-10-20 MED ORDER — ACETAMINOPHEN 650 MG RE SUPP: 650.0000 mg | Freq: Four times a day (QID) | RECTAL | Status: DC | PRN

## 2014-10-20 MED ORDER — ACETAMINOPHEN 325 MG PO TABS
650.0000 mg | ORAL_TABLET | Freq: Four times a day (QID) | ORAL | Status: DC | PRN
Start: 2014-10-20 — End: 2014-10-21

## 2014-10-20 MED ORDER — INSULIN ASPART 100 UNIT/ML ~~LOC~~ SOLN: 0.0000 [IU] | SUBCUTANEOUS | Status: DC

## 2014-10-20 MED ORDER — SODIUM CHLORIDE 0.9 % IJ SOLN: 3.0000 mL | Freq: Two times a day (BID) | INTRAMUSCULAR | Status: DC

## 2014-10-20 MED ORDER — DEXTROSE 5 % IV SOLN
1.0000 g | INTRAVENOUS | Status: DC
  Filled 2014-10-20: qty 10

## 2014-10-20 MED ORDER — AMLODIPINE BESYLATE 10 MG PO TABS
10.0000 mg | ORAL_TABLET | Freq: Every day | ORAL | Status: DC
  Administered 2014-10-21: 10 mg via ORAL
  Filled 2014-10-20: qty 1

## 2014-10-20 MED ORDER — INSULIN GLARGINE 100 UNIT/ML ~~LOC~~ SOLN
10.0000 [IU] | Freq: Every day | SUBCUTANEOUS | Status: DC
  Filled 2014-10-20: qty 0.1

## 2014-10-20 MED ORDER — DEXTROSE 50 % IV SOLN: 25.0000 mL | INTRAVENOUS | Status: DC | PRN

## 2014-10-20 NOTE — ED Notes (Signed)
Per EMS - pt comes from home where she lives alone with c/o anxiety attack which began this morning.  Patient has been sick with N/V for past 2 days and believes that precipitates the anxiety.  Patient's chief complaint however is anxiety.  Vitals 126/96, HR 114, RR 20, 99% on RA.  CBG 303  Patient received 4 mg of Zofran IVP en route with EMS.

## 2014-10-20 NOTE — ED Provider Notes (Signed)
CSN: [id number]     Arrival date & time 10/20/14  1426 History   First MD Initiated Contact with Patient 10/20/14 1457     Chief Complaint  Patient presents with  . Anxiety     (Consider location/radiation/quality/duration/timing/severity/associated sxs/prior Treatment) HPI   53 year old female with history of non-insulin-dependent diabetes, hypertension who was brought here via EMS for evaluations of "anxiety".  Patient report she has been dealing with bouts of anxiety and panic attack ongoing for the past 5 months. States she would have 2-3 bouts per week, with the last episode has been ongoing for the past 2 days. States anxiety is getting progressively worse. She is having crying spells having trouble breathing. She endorsed feeling nauseous and actually vomited several times of nonbloody nonbilious vomitus. She attributed her stress to her dad's state of health as he is about to undergo open heart surgery. Today her anxiety is unbearable therefore she contacted EMS to come to ER. She denies having any active pain denies any headache, neck pain, productive cough, hemoptysis, chest pain, dyspnea on exertion, heart palpitation, abdominal pain, back pain, numbness or weakness, or rash. She denies any dysuria. She is not a drinker and denies any recreational drug use. Denies any history of thyroid disease or history of arrhythmia. She states she did take her diabetic medication today. She reports she was seen in the ER for similar episode in the past and Ativan has helped. She does have a PCP. She denies any prior history of PE or DVT, no recent surgery, prolonged bed rest, unilateral leg swelling or calf pain, history of active cancer of currently on birth control. She has a prior hysterectomy.  Past Medical History  Diagnosis Date  . Hypertension   . Diabetes mellitus without complication    Past Surgical History  Procedure Laterality Date  . Hysteroscopy w/d&c N/A 06/11/2013    Procedure:  DILATATION AND CURETTAGE /HYSTEROSCOPY/myomyectomy;  Surgeon: Emily Filbert, MD;  Location: [facility];  Service: Gynecology;  Laterality: N/A;   No family history on file. History  Substance Use Topics  . Smoking status: Former Smoker -- 0.25 packs/day for 20 years    Types: Cigarettes  . Smokeless tobacco: Not on file  . Alcohol Use: No   OB History    Gravida Para Term Preterm AB TAB SAB Ectopic Multiple Living   0              Review of Systems  All other systems reviewed and are negative.     Allergies  Latex and Penicillins  Home Medications   Prior to Admission medications   Medication Sig Start Date End Date Taking? Authorizing Provider  amLODipine (NORVASC) 10 MG tablet Take 10 mg by mouth daily.    Historical Provider, MD  atenolol (TENORMIN) 50 MG tablet Take 50 mg by mouth daily.    Historical Provider, MD  clotrimazole (LOTRIMIN) 1 % cream Apply to affected area on right lower leg 2 times daily for 10 days 05/28/14   Ernestina Patches, MD  ferrous sulfate 325 (65 FE) MG tablet Take 325 mg by mouth daily with breakfast.    Historical Provider, MD  gabapentin (NEURONTIN) 300 MG capsule Take 1,200 mg by mouth daily.    Historical Provider, MD  glipiZIDE (GLUCOTROL) 10 MG tablet Take 10 mg by mouth 3 (three) times daily.    Historical Provider, MD  hydrochlorothiazide (HYDRODIURIL) 25 MG tablet Take 25 mg by mouth daily.    Historical Provider, MD  ivermectin (STROMECTOL) 3 MG TABS tablet Take 5 tablets (15,000 mcg total) by mouth once. 05/28/14   Ernestina Patches, MD  metFORMIN (GLUCOPHAGE) 850 MG tablet Take 850 mg by mouth 2 (two) times daily with a meal.    Historical Provider, MD  permethrin (ELIMITE) 5 % cream Apply 1 application topically once.    Historical Provider, MD  promethazine (PHENERGAN) 25 MG tablet Take 1 tablet (25 mg total) by mouth every 6 (six) hours as needed for nausea or vomiting. 05/28/14   Ernestina Patches, MD   BP 131/97 mmHg  Pulse 101  Temp(Src) 98.6 F  (37 C) (Oral)  Resp 28  Ht 5\' 2"  (1.575 m)  Wt 149 lb (67.586 kg)  BMI 27.25 kg/m2  SpO2 100% Physical Exam  Constitutional: She is oriented to person, place, and time. She appears well-developed and well-nourished. No distress.  Patient is nontoxic in appearance however is tearful and breathing harshly.  HENT:  Head: Atraumatic.  Eyes: Conjunctivae are normal.  Neck: Normal range of motion. Neck supple. No JVD present.  No nuchal rigidity  Cardiovascular:  Mild tachycardia without murmurs rubs or gallops  Pulmonary/Chest: Effort normal. No respiratory distress. She has no wheezes. She has no rales.  Abdominal: Soft. There is no tenderness.  Musculoskeletal: She exhibits no edema.  Neurological: She is alert and oriented to person, place, and time. GCS eye subscore is 4. GCS verbal subscore is 5. GCS motor subscore is 6.  Skin: No rash noted.  Psychiatric: She has a normal mood and affect.  Nursing note and vitals reviewed.   ED Course  Procedures (including critical care time)  3:10 PM Patient presents complaining of anxiety and having panic attack. Denies SI/HI/hallucination. She attributed her anxiety due to her father states of health. We'll perform screening to ensure no evidence of underlying medical problem that can contribute to her symptoms.  5:09 PM Patient's UA shows evidence of a urinary tract infection. Her UDS is positive for THC, which may also contribute to her anxiety. She has normal WBC and a d-dimer was negative. Chest x-ray shows no acute cardiopulmonary disease.  5:25 PM Patient is a diabetic. UA with evidence of air tract infection. She has an anion gap of 27 with greater than 80 ketones in Urine.  Her CBG is 267 however she will need glucostabilizer protocol to close her gap.  She will need to be admitted for DKA.    6:36 PM i have consulted with [facility], Dr. Roel Cluck who agrees to admit pt to tele bed, under her care. Will repeat BMP per  request.  Pt aware of plan.  CRITICAL CARE Performed by: Domenic Moras Total critical care time: 30 min Critical care time was exclusive of separately billable procedures and treating other patients. Critical care was necessary to treat or prevent imminent or life-threatening deterioration. Critical care was time spent personally by me on the following activities: development of treatment plan with patient and/or surrogate as well as nursing, discussions with consultants, evaluation of patient's response to treatment, examination of patient, obtaining history from patient or surrogate, ordering and performing treatments and interventions, ordering and review of laboratory studies, ordering and review of radiographic studies, pulse oximetry and re-evaluation of patient's condition.    Labs Review Labs Reviewed  CBC WITH DIFFERENTIAL - Abnormal; Notable for the following:    RBC 5.63 (*)    Hemoglobin 16.5 (*)    HCT 46.3 (*)    Platelets 448 (*)    All  other components within normal limits  COMPREHENSIVE METABOLIC PANEL - Abnormal; Notable for the following:    Sodium 136 (*)    Chloride 90 (*)    Glucose, Bld 267 (*)    Calcium 11.0 (*)    Total Protein 9.1 (*)    GFR calc non Af Amer 61 (*)    GFR calc Af Amer 71 (*)    Anion gap 27 (*)    All other components within normal limits  URINALYSIS, ROUTINE W REFLEX MICROSCOPIC - Abnormal; Notable for the following:    APPearance CLOUDY (*)    Glucose, UA 100 (*)    Hgb urine dipstick TRACE (*)    Bilirubin Urine MODERATE (*)    Ketones, ur >80 (*)    Protein, ur 100 (*)    Leukocytes, UA MODERATE (*)    All other components within normal limits  URINE RAPID DRUG SCREEN (HOSP PERFORMED) - Abnormal; Notable for the following:    Tetrahydrocannabinol POSITIVE (*)    All other components within normal limits  URINE MICROSCOPIC-ADD ON - Abnormal; Notable for the following:    Squamous Epithelial / LPF FEW (*)    Casts HYALINE CASTS (*)     All other components within normal limits  CBG MONITORING, ED - Abnormal; Notable for the following:    Glucose-Capillary 256 (*)    All other components within normal limits  D-DIMER, QUANTITATIVE    Imaging Review Dg Chest 2 View  10/20/2014   CLINICAL DATA:  53 year old female with anxiety, nausea and vomiting  EXAM: CHEST  2 VIEW  COMPARISON:  Prior chest x-ray 05/26/2014  FINDINGS: The lungs are clear and negative for focal airspace consolidation, pulmonary edema or suspicious pulmonary nodule. No pleural effusion or pneumothorax. Cardiac and mediastinal contours are within normal limits. No acute fracture or lytic or blastic osseous lesions. The visualized upper abdominal bowel gas pattern is unremarkable.  IMPRESSION: Negative chest x-ray.   Electronically Signed   By: Jacqulynn Cadet M.D.   On: 10/20/2014 16:15     EKG Interpretation None      Date: 10/20/2014  Rate: 105  Rhythm: sinus tachycardia  QRS Axis: normal  Intervals: borderline prolonged QT interval  ST/T Wave abnormalities: normal  Conduction Disutrbances:none  Narrative Interpretation:   Old EKG Reviewed: none available    MDM   Final diagnoses:  Anxiety  UTI (lower urinary tract infection)  Diabetic ketoacidosis without coma associated with diabetes mellitus due to underlying condition    BP 123/79 mmHg  Pulse 93  Temp(Src) 98 F (36.7 C) (Oral)  Resp 15  Ht 5\' 2"  (1.575 m)  Wt 149 lb (67.586 kg)  BMI 27.25 kg/m2  SpO2 100%  I have reviewed nursing notes and vital signs. I personally reviewed the imaging tests through PACS system  I reviewed available ER/hospitalization records thought the EMR     Domenic Moras, PA-C 10/20/14 [place], MD 10/20/14 2256

## 2014-10-20 NOTE — H&P (Addendum)
PCP: Daylene Posey at [facility]  Chief Complaint:  Nausea and vomiting  HPI: Marie Fox is a 53 y.o. female   has a past medical history of Hypertension and Diabetes mellitus without complication.   Presented with  Patient has type 2 diabetes on Metformin and glipizide only. She was diagnosed 4 years ago.  Patient reports that for the past 2 days she have had nausea and vomiting denies any diarrhea, reports some chills, no Chest pain no shortness of breath, no dysuria.  Patient felt very anxious because her father was going to have cardiac catheterization done and presented to [facility] ER she was found to be tachycardic and tachypneic but this improved with IV Ativan 1mg  . She reports no PO intake for the past 2 days due to anxiety and nausea. She have been sipping small amounts of water.  Patient has no prior hx of DKA. On arrival her labs were significant for BG of 267, AG 27 and Bicarb of 19.  She was noted to have UTI, She was given 2 L of NS, and started on IV rocephin and her nausea was under control she is now able to eat. Repeat BG 256.   Hospitalist was called for admission for hyperglycemia and dehydration with Anion gap acidosis and UTI  Review of Systems:    Pertinent positives include:   Constitutional:  No weight loss, night sweats, Fevers, chills, fatigue, weight loss  HEENT:  No headaches, Difficulty swallowing,Tooth/dental problems,Sore throat,  No sneezing, itching, ear ache, nasal congestion, post nasal drip,  Cardio-vascular:  No chest pain, Orthopnea, PND, anasarca, dizziness, palpitations.no Bilateral lower extremity swelling  GI:  No heartburn, indigestion, abdominal pain, nausea, vomiting, diarrhea, change in bowel habits, loss of appetite, melena, blood in stool, hematemesis Resp:  no shortness of breath at rest. No dyspnea on exertion, No excess mucus, no productive cough, No non-productive cough, No coughing up of blood.No change in color of mucus.No  wheezing. Skin:  no rash or lesions. No jaundice GU:  no dysuria, change in color of urine, no urgency or frequency. No straining to urinate.  No flank pain.  Musculoskeletal:  No joint pain or no joint swelling. No decreased range of motion. No back pain.  Psych:  No change in mood or affect. No depression or anxiety. No memory loss.  Neuro: no localizing neurological complaints, no tingling, no weakness, no double vision, no gait abnormality, no slurred speech, no confusion  Otherwise ROS are negative except for above, 10 systems were reviewed  Past Medical History: Past Medical History  Diagnosis Date  . Hypertension   . Diabetes mellitus without complication    Past Surgical History  Procedure Laterality Date  . Hysteroscopy w/d&c N/A 06/11/2013    Procedure: DILATATION AND CURETTAGE /HYSTEROSCOPY/myomyectomy;  Surgeon: Emily Filbert, MD;  Location: [facility];  Service: Gynecology;  Laterality: N/A;     Medications: Prior to Admission medications   Medication Sig Start Date End Date Taking? Authorizing Provider  amLODipine (NORVASC) 10 MG tablet Take 10 mg by mouth daily.   Yes Historical Provider, MD  atenolol (TENORMIN) 50 MG tablet Take 50 mg by mouth daily.   Yes Historical Provider, MD  ferrous sulfate 325 (65 FE) MG tablet Take 325 mg by mouth daily with breakfast.   Yes Historical Provider, MD  gabapentin (NEURONTIN) 800 MG tablet Take 1,600 mg by mouth 2 (two) times daily.   Yes Historical Provider, MD  glipiZIDE (GLUCOTROL) 10 MG tablet Take 10 mg  by mouth 2 (two) times daily before a meal.    Yes Historical Provider, MD  hydrochlorothiazide (HYDRODIURIL) 25 MG tablet Take 25 mg by mouth daily.   Yes Historical Provider, MD  metFORMIN (GLUCOPHAGE) 850 MG tablet Take 850 mg by mouth 2 (two) times daily with a meal.   Yes Historical Provider, MD  promethazine (PHENERGAN) 25 MG tablet Take 1 tablet (25 mg total) by mouth every 6 (six) hours as needed for nausea or vomiting.  05/28/14  Yes Ernestina Patches, MD  clotrimazole (LOTRIMIN) 1 % cream Apply to affected area on right lower leg 2 times daily for 10 days Patient not taking: Reported on 10/20/2014 05/28/14   Ernestina Patches, MD  ivermectin (STROMECTOL) 3 MG TABS tablet Take 5 tablets (15,000 mcg total) by mouth once. Patient not taking: Reported on 10/20/2014 05/28/14   Ernestina Patches, MD    Allergies:   Allergies  Allergen Reactions  . Latex Itching  . Penicillins Nausea Only    Social History:  Ambulatory  independently  cane Lives at home alone      reports that she has quit smoking. Her smoking use included Cigarettes. She has a 5 pack-year smoking history. She does not have any smokeless tobacco history on file. She reports that she does not drink alcohol or use illicit drugs.    Family History: family history includes CAD in her father and mother; Diabetes type II in her brother, father, and mother; Hypertension in her father and mother; Kidney failure in her mother.    Physical Exam: Patient Vitals for the past 24 hrs:  BP Temp Temp src Pulse Resp SpO2 Height Weight  10/20/14 1737 123/79 mmHg 98 F (36.7 C) Oral 93 15 100 % - -  10/20/14 1440 131/97 mmHg 98.6 F (37 C) Oral 101 (!) 28 100 % 5\' 2"  (1.575 m) 67.586 kg (149 lb)    1. General:  in No Acute distress 2. Psychological: Alert and Oriented 3. Head/ENT:   Moist  Mucous Membranes                          Head Non traumatic, neck supple                          Normal  Dentition 4. SKIN:  ecreased Skin turgor,  Skin clean Dry and intact no rash 5. Heart: Regular rate and rhythm no Murmur, Rub or gallop 6. Lungs:  no wheezes or crackles   7. Abdomen: Soft, non-tender, Non distended 8. Lower extremities: no clubbing, cyanosis, or edema 9. Neurologically Grossly intact, moving all 4 extremities equally 10. MSK: Normal range of motion  body mass index is 27.25 kg/(m^2).   Labs on Admission:   Results for orders placed or  performed during the hospital encounter of 10/20/14 (from the past 24 hour(s))  D-dimer, quantitative     Status: None   Collection Time: 10/20/14  3:06 PM  Result Value Ref Range   D-Dimer, Quant 0.35 0.00 - 0.48 ug/mL-FEU  CBC with Differential     Status: Abnormal   Collection Time: 10/20/14  3:31 PM  Result Value Ref Range   WBC 8.7 4.0 - 10.5 K/uL   RBC 5.63 (H) 3.87 - 5.11 MIL/uL   Hemoglobin 16.5 (H) 12.0 - 15.0 g/dL   HCT 46.3 (H) 36.0 - 46.0 %   MCV 82.2 78.0 - 100.0 fL   MCH 29.3  26.0 - 34.0 pg   MCHC 35.6 30.0 - 36.0 g/dL   RDW 13.1 11.5 - 15.5 %   Platelets 448 (H) 150 - 400 K/uL   Neutrophils Relative % 55 43 - 77 %   Neutro Abs 4.8 1.7 - 7.7 K/uL   Lymphocytes Relative 37 12 - 46 %   Lymphs Abs 3.2 0.7 - 4.0 K/uL   Monocytes Relative 7 3 - 12 %   Monocytes Absolute 0.6 0.1 - 1.0 K/uL   Eosinophils Relative 1 0 - 5 %   Eosinophils Absolute 0.0 0.0 - 0.7 K/uL   Basophils Relative 0 0 - 1 %   Basophils Absolute 0.0 0.0 - 0.1 K/uL  Comprehensive metabolic panel     Status: Abnormal   Collection Time: 10/20/14  3:31 PM  Result Value Ref Range   Sodium 136 (L) 137 - 147 mEq/L   Potassium 3.9 3.7 - 5.3 mEq/L   Chloride 90 (L) 96 - 112 mEq/L   CO2 19 19 - 32 mEq/L   Glucose, Bld 267 (H) 70 - 99 mg/dL   BUN 19 6 - 23 mg/dL   Creatinine, Ser 1.03 0.50 - 1.10 mg/dL   Calcium 11.0 (H) 8.4 - 10.5 mg/dL   Total Protein 9.1 (H) 6.0 - 8.3 g/dL   Albumin 4.6 3.5 - 5.2 g/dL   AST 18 0 - 37 U/L   ALT 10 0 - 35 U/L   Alkaline Phosphatase 110 39 - 117 U/L   Total Bilirubin 0.5 0.3 - 1.2 mg/dL   GFR calc non Af Amer 61 (L) >90 mL/min   GFR calc Af Amer 71 (L) >90 mL/min   Anion gap 27 (H) 5 - 15  Urinalysis, Routine w reflex microscopic     Status: Abnormal   Collection Time: 10/20/14  3:37 PM  Result Value Ref Range   Color, Urine YELLOW YELLOW   APPearance CLOUDY (A) CLEAR   Specific Gravity, Urine 1.017 1.005 - 1.030   pH 6.0 5.0 - 8.0   Glucose, UA 100 (A) NEGATIVE  mg/dL   Hgb urine dipstick TRACE (A) NEGATIVE   Bilirubin Urine MODERATE (A) NEGATIVE   Ketones, ur >80 (A) NEGATIVE mg/dL   Protein, ur 100 (A) NEGATIVE mg/dL   Urobilinogen, UA 1.0 0.0 - 1.0 mg/dL   Nitrite NEGATIVE NEGATIVE   Leukocytes, UA MODERATE (A) NEGATIVE  Urine rapid drug screen (hosp performed)     Status: Abnormal   Collection Time: 10/20/14  3:37 PM  Result Value Ref Range   Opiates NONE DETECTED NONE DETECTED   Cocaine NONE DETECTED NONE DETECTED   Benzodiazepines NONE DETECTED NONE DETECTED   Amphetamines NONE DETECTED NONE DETECTED   Tetrahydrocannabinol POSITIVE (A) NONE DETECTED   Barbiturates NONE DETECTED NONE DETECTED  Urine microscopic-add on     Status: Abnormal   Collection Time: 10/20/14  3:37 PM  Result Value Ref Range   Squamous Epithelial / LPF FEW (A) RARE   WBC, UA 11-20 <3 WBC/hpf   Bacteria, UA RARE RARE   Casts HYALINE CASTS (A) NEGATIVE  CBG monitoring, ED     Status: Abnormal   Collection Time: 10/20/14  6:00 PM  Result Value Ref Range   Glucose-Capillary 256 (H) 70 - 99 mg/dL    UA evidenece of UTI, and proteinuria  Lab Results  Component Value Date   HGBA1C 10.5* 05/26/2014    Estimated Creatinine Clearance: 56.9 mL/min (by C-G formula based on Cr of 1.03).  BNP (  last 3 results) No results for input(s): PROBNP in the last 8760 hours.  Other results:  I have pearsonaly reviewed this: ECG REPORT  Rate: 105  Rhythm: ST ST&T Change: no ischemia   Filed Weights   10/20/14 1440  Weight: 67.586 kg (149 lb)   Cultures: No results found for: SDES, SPECREQUEST, CULT, REPTSTATUS   Radiological Exams on Admission: Dg Chest 2 View  10/20/2014   CLINICAL DATA:  53 year old female with anxiety, nausea and vomiting  EXAM: CHEST  2 VIEW  COMPARISON:  Prior chest x-ray 05/26/2014  FINDINGS: The lungs are clear and negative for focal airspace consolidation, pulmonary edema or suspicious pulmonary nodule. No pleural effusion or  pneumothorax. Cardiac and mediastinal contours are within normal limits. No acute fracture or lytic or blastic osseous lesions. The visualized upper abdominal bowel gas pattern is unremarkable.  IMPRESSION: Negative chest x-ray.   Electronically Signed   By: Jacqulynn Cadet M.D.   On: 10/20/2014 16:15    Chart has been reviewed  Assessment/Plan  53 yo F with hx of  DM on oral medications only , HTN here with dehydration, and metabolic gap acidosis in the setting of poor PO intake.   Present on Admission:  . DKA (diabetic ketoacidoses)/metabolic acidosis - initial picture was worissome for DKA other possibility could be metabolic acidosis in the setting of  poor PO intake evidence of dehydartion in this type 2 diabetic not requiring insulin at baseline and with no prior DKA history with moderately elevated BG.   Repeat BMET   after patient have been rehydrated showing improved but persistent anion gap acidosis will start on glucose stabalizer.   As part of work up will cycle CE.  Marland Kitchen UTI (lower urinary tract infection)- treat with rocephin, await result of urine culture . Dehydration  - IVF and check orthostatics Hypokalemia - will repalce Hypertension - given hx of DM and proteinuria will add ACEi and monitor K and Cr, hold HCTZ in the setting of dehyardtion Prophylaxis:  Lovenox, Protonix  CODE STATUS:  FULL CODE    Other plan as per orders.  I have spent a total of 55 min on this admission  Jasmin Trumbull 10/20/2014, 6:54 PM  [facility]  Pager [phone]   after 2 AM please page floor coverage PA If 7AM-7PM, please contact the day team taking care of the patient  [email or web address]  Password [email or web address]

## 2014-10-20 NOTE — ED Notes (Signed)
The only LDA the patient arrived with is the PIV in her LAC.

## 2014-10-20 NOTE — ED Notes (Signed)
Bed: AU63 Expected date:  Expected time:  Means of arrival:  Comments: EMS/n/v/anxiety

## 2014-10-20 NOTE — Plan of Care (Signed)
Problem: Phase I Progression Outcomes Goal: CBGs steadily decreasing on IV insulin drip Outcome: Completed/Met Date Met:  10/20/14 Goal: Monitor hydration status Outcome: Completed/Met Date Met:  10/20/14 Goal: Pain controlled with appropriate interventions Outcome: Completed/Met Date Met:  10/20/14 Goal: OOB as tolerated unless otherwise ordered Outcome: Completed/Met Date Met:  10/20/14 Goal: Voiding-avoid urinary catheter unless indicated Outcome: Completed/Met Date Met:  10/20/14

## 2014-10-20 NOTE — ED Notes (Signed)
Patient comes from home with c/o anxiety and N/V.  Patient states anxiety preceded N/V.  Patient states she doesn't know what precipitated the anxiety attack, but states she has had them previously. Patient complains of nausea, which she states makes her anxiety worse.  Patient's lung sounds clear.  No S3/S4 or split heard.  +2 radial and tibial pulses palpated. Bowel sounds present, but hypoactive.

## 2014-10-21 DIAGNOSIS — E1310 Other specified diabetes mellitus with ketoacidosis without coma: Principal | ICD-10-CM

## 2014-10-21 LAB — COMPREHENSIVE METABOLIC PANEL
ALT: 8 U/L (ref 0–35)
AST: 13 U/L (ref 0–37)
Albumin: 3.9 g/dL (ref 3.5–5.2)
Alkaline Phosphatase: 89 U/L (ref 39–117)
Anion gap: 15 (ref 5–15)
BUN: 18 mg/dL (ref 6–23)
CALCIUM: 9.7 mg/dL (ref 8.4–10.5)
CO2: 25 meq/L (ref 19–32)
CREATININE: 0.87 mg/dL (ref 0.50–1.10)
Chloride: 98 mEq/L (ref 96–112)
GFR, EST AFRICAN AMERICAN: 87 mL/min — AB (ref >90)
GFR, EST NON AFRICAN AMERICAN: 75 mL/min — AB (ref >90)
GLUCOSE: 140 mg/dL — AB (ref 70–99)
Potassium: 3.5 mEq/L — ABNORMAL LOW (ref 3.7–5.3)
Sodium: 138 mEq/L (ref 137–147)
Total Bilirubin: 0.4 mg/dL (ref 0.3–1.2)
Total Protein: 7.5 g/dL (ref 6.0–8.3)

## 2014-10-21 LAB — GLUCOSE, CAPILLARY
GLUCOSE-CAPILLARY: 104 mg/dL — AB (ref 70–99)
GLUCOSE-CAPILLARY: 143 mg/dL — AB (ref 70–99)
GLUCOSE-CAPILLARY: 236 mg/dL — AB (ref 70–99)
Glucose-Capillary: 267 mg/dL — ABNORMAL HIGH (ref 70–99)
Glucose-Capillary: 300 mg/dL — ABNORMAL HIGH (ref 70–99)
Glucose-Capillary: 193 mg/dL — ABNORMAL HIGH (ref 70–99)
Glucose-Capillary: 135 mg/dL — ABNORMAL HIGH (ref 70–99)
Glucose-Capillary: 182 mg/dL — ABNORMAL HIGH (ref 70–99)
Glucose-Capillary: 151 mg/dL — ABNORMAL HIGH (ref 70–99)
Glucose-Capillary: 91 mg/dL (ref 70–99)
Glucose-Capillary: 147 mg/dL — ABNORMAL HIGH (ref 70–99)
Glucose-Capillary: 233 mg/dL — ABNORMAL HIGH (ref 70–99)
Glucose-Capillary: 155 mg/dL — ABNORMAL HIGH (ref 70–99)
Glucose-Capillary: 184 mg/dL — ABNORMAL HIGH (ref 70–99)

## 2014-10-21 LAB — CBC
HCT: 38.3 % (ref 36.0–46.0)
Hemoglobin: 13.3 g/dL (ref 12.0–15.0)
MCH: 28.9 pg (ref 26.0–34.0)
MCHC: 34.7 g/dL (ref 30.0–36.0)
MCV: 83.1 fL (ref 78.0–100.0)
PLATELETS: 408 10*3/uL — AB (ref 150–400)
RBC: 4.61 MIL/uL (ref 3.87–5.11)
RDW: 13.2 % (ref 11.5–15.5)
WBC: 7.7 10*3/uL (ref 4.0–10.5)

## 2014-10-21 LAB — BASIC METABOLIC PANEL
ANION GAP: 16 — AB (ref 5–15)
BUN: 11 mg/dL (ref 6–23)
CALCIUM: 9.8 mg/dL (ref 8.4–10.5)
CO2: 23 meq/L (ref 19–32)
Chloride: 99 mEq/L (ref 96–112)
Creatinine, Ser: 0.7 mg/dL (ref 0.50–1.10)
GFR calc Af Amer: >90 mL/min (ref >90)
GFR calc non Af Amer: >90 mL/min (ref >90)
Glucose, Bld: 127 mg/dL — ABNORMAL HIGH (ref 70–99)
Potassium: 3.7 mEq/L (ref 3.7–5.3)
SODIUM: 138 meq/L (ref 137–147)

## 2014-10-21 LAB — TSH: TSH: 2.8 u[IU]/mL (ref 0.350–4.500)

## 2014-10-21 LAB — HEMOGLOBIN A1C
Hgb A1c MFr Bld: 12.4 % — ABNORMAL HIGH (ref <5.7)
MEAN PLASMA GLUCOSE: 309 mg/dL — AB (ref <117)

## 2014-10-21 LAB — MAGNESIUM: Magnesium: 1.6 mg/dL (ref 1.5–2.5)

## 2014-10-21 LAB — PHOSPHORUS: Phosphorus: 3.8 mg/dL (ref 2.3–4.6)

## 2014-10-21 LAB — TROPONIN I

## 2014-10-21 MED ORDER — RAMIPRIL 2.5 MG PO CAPS: 2.5000 mg | ORAL_CAPSULE | Freq: Every day | ORAL | Status: DC

## 2014-10-21 MED ORDER — POTASSIUM CHLORIDE CRYS ER 20 MEQ PO TBCR
40.0000 meq | EXTENDED_RELEASE_TABLET | Freq: Once | ORAL | Status: AC
  Administered 2014-10-21: 40 meq via ORAL
  Filled 2014-10-21: qty 2

## 2014-10-21 MED ORDER — DEXTROSE-NACL 5-0.45 % IV SOLN
INTRAVENOUS | Status: DC
  Administered 2014-10-21 (×2): via INTRAVENOUS

## 2014-10-21 MED ORDER — INSULIN ASPART 100 UNIT/ML ~~LOC~~ SOLN: 0.0000 [IU] | Freq: Three times a day (TID) | SUBCUTANEOUS | Status: DC

## 2014-10-21 MED ORDER — INSULIN GLARGINE 100 UNIT/ML SOLOSTAR PEN: 10.0000 [IU] | PEN_INJECTOR | Freq: Every day | SUBCUTANEOUS | Status: AC

## 2014-10-21 MED ORDER — INSULIN GLARGINE 100 UNIT/ML ~~LOC~~ SOLN
10.0000 [IU] | Freq: Every day | SUBCUTANEOUS | Status: DC
  Administered 2014-10-21: 10 [IU] via SUBCUTANEOUS
  Filled 2014-10-21: qty 0.1

## 2014-10-21 MED ORDER — INSULIN ASPART 100 UNIT/ML ~~LOC~~ SOLN
0.0000 [IU] | Freq: Three times a day (TID) | SUBCUTANEOUS | Status: DC
  Administered 2014-10-21: 2 [IU] via SUBCUTANEOUS

## 2014-10-21 MED ORDER — INSULIN ASPART 100 UNIT/ML ~~LOC~~ SOLN: 0.0000 [IU] | Freq: Every day | SUBCUTANEOUS | Status: DC

## 2014-10-21 MED ORDER — LEVOFLOXACIN 500 MG PO TABS: 500.0000 mg | ORAL_TABLET | Freq: Every day | ORAL | Status: DC

## 2014-10-22 LAB — URINE CULTURE
CULTURE: NO GROWTH
Colony Count: NO GROWTH
Special Requests: NORMAL

## 2014-10-25 NOTE — Discharge Summary (Signed)
Physician Discharge Summary  Marie Fox QMV:[id number] DOB: 1961/08/25 DOA: 10/20/2014  PCP: PROVIDER NOT IN SYSTEM  Admit date: 10/20/2014 Discharge date: 10/21/2014  Time spent: 33minutes  Recommendations for Outpatient Follow-up:  1. follo wup with PCP in one week.   Discharge Diagnoses:  Active Problems:   DKA (diabetic ketoacidoses)   UTI (lower urinary tract infection)   Dehydration   Discharge Condition: improved.   Diet recommendation: carb modified   Filed Weights   10/20/14 1440 10/20/14 1955  Weight: 67.586 kg (149 lb) 64.638 kg (142 lb 8 oz)    History of present illness:  Marie Fox is a 53 y.o. female has type 2 diabetes on Metformin and glipizide only. She was diagnosed 4 years ago. Patient reports that for the past 2 days she have had nausea and vomiting. She was found to be in DKA and dehydrated.   Hospital Course:  DKA: Mild possibly from dehdyration. Started on IV insulin gtt and later on transitioned to lantus and SSI.  She was recommended to follow up with PCP in less than one week.  Diabetes coordinator consulted and outpatient education ordered.    Dehydration: Hydrated and her symptoms of nausea and vomiting resolved.    Hypertension: controlled on discharge. Resume home medics.    Hypokalemia : repleted.   Procedures:  none  Consultations:  Diabetes co ordinator.   Discharge Exam: Filed Vitals:   10/21/14 1439  BP: 115/84  Pulse: 86  Temp: 97.6 F (36.4 C)  Resp: 24    General: alert afebrile comfortable Cardiovascular: s1s2 Respiratory: ctab  Discharge Instructions You were cared for by a hospitalist during your hospital stay. If you have any questions about your discharge medications or the care you received while you were in the hospital after you are discharged, you can call the unit and asked to speak with the hospitalist on call if the hospitalist that took care of you is not available. Once you are discharged, your  primary care physician will handle any further medical issues. Please note that NO REFILLS for any discharge medications will be authorized once you are discharged, as it is imperative that you return to your primary care physician (or establish a relationship with a primary care physician if you do not have one) for your aftercare needs so that they can reassess your need for medications and monitor your lab values.  Discharge Instructions    Diet - low sodium heart healthy    Complete by:  As directed      Discharge instructions    Complete by:  As directed   Follow up with PCP on Monday.          Discharge Medication List as of 10/21/2014  3:53 PM    START taking these medications   Details  insulin aspart (NOVOLOG) 100 UNIT/ML injection Inject 0-15 Units into the skin 3 (three) times daily with meals., Starting 10/21/2014, Until Discontinued, Print    Insulin Glargine (LANTUS SOLOSTAR) 100 UNIT/ML Solostar Pen Inject 10 Units into the skin daily at 10 pm., Starting 10/21/2014, Until Discontinued, Print    levofloxacin (LEVAQUIN) 500 MG tablet Take 1 tablet (500 mg total) by mouth daily., Starting 10/21/2014, Until Discontinued, Print    ramipril (ALTACE) 2.5 MG capsule Take 1 capsule (2.5 mg total) by mouth daily., Starting 10/21/2014, Until Discontinued, Normal      CONTINUE these medications which have NOT CHANGED   Details  amLODipine (NORVASC) 10 MG tablet Take 10 mg by  mouth daily., Until Discontinued, Historical Med    atenolol (TENORMIN) 50 MG tablet Take 50 mg by mouth daily., Until Discontinued, Historical Med    ferrous sulfate 325 (65 FE) MG tablet Take 325 mg by mouth daily with breakfast., Until Discontinued, Historical Med    gabapentin (NEURONTIN) 800 MG tablet Take 1,600 mg by mouth 2 (two) times daily., Until Discontinued, Historical Med    glipiZIDE (GLUCOTROL) 10 MG tablet Take 10 mg by mouth 2 (two) times daily before a meal. , Until Discontinued, Historical  Med    metFORMIN (GLUCOPHAGE) 850 MG tablet Take 850 mg by mouth 2 (two) times daily with a meal., Until Discontinued, Historical Med    promethazine (PHENERGAN) 25 MG tablet Take 1 tablet (25 mg total) by mouth every 6 (six) hours as needed for nausea or vomiting., Starting 05/28/2014, Until Discontinued, Print      STOP taking these medications     hydrochlorothiazide (HYDRODIURIL) 25 MG tablet      clotrimazole (LOTRIMIN) 1 % cream      ivermectin (STROMECTOL) 3 MG TABS tablet        Allergies  Allergen Reactions  . Latex Itching  . Penicillins Nausea Only   Follow-up Information    Follow up with PROVIDER NOT IN SYSTEM.       The results of significant diagnostics from this hospitalization (including imaging, microbiology, ancillary and laboratory) are listed below for reference.    Significant Diagnostic Studies: Dg Chest 2 View  10/20/2014   CLINICAL DATA:  53 year old female with anxiety, nausea and vomiting  EXAM: CHEST  2 VIEW  COMPARISON:  Prior chest x-ray 05/26/2014  FINDINGS: The lungs are clear and negative for focal airspace consolidation, pulmonary edema or suspicious pulmonary nodule. No pleural effusion or pneumothorax. Cardiac and mediastinal contours are within normal limits. No acute fracture or lytic or blastic osseous lesions. The visualized upper abdominal bowel gas pattern is unremarkable.  IMPRESSION: Negative chest x-ray.   Electronically Signed   By: Jacqulynn Cadet M.D.   On: 10/20/2014 16:15    Microbiology: Recent Results (from the past 240 hour(s))  Culture, Urine     Status: None   Collection Time: 10/21/14  6:12 AM  Result Value Ref Range Status   Specimen Description URINE, CLEAN CATCH  Final   Special Requests Normal  Final   Culture  Setup Time   Final    10/21/2014 11:30 Performed at [place] Performed at [facility]   Final   Culture NO GROWTH Performed at [facility]    Final   Report Status 10/22/2014 FINAL  Final     Labs: Basic Metabolic Panel:  Recent Labs Lab 10/20/14 1531 10/20/14 1906 10/21/14 0156 10/21/14 1100  NA 136* 136* 138 138  K 3.9 3.4* 3.5* 3.7  CL 90* 96 98 99  CO2 19 20 25 23   GLUCOSE 267* 262* 140* 127*  BUN 19 18 18 11   CREATININE 1.03 0.91 0.87 0.70  CALCIUM 11.0* 9.2 9.7 9.8  MG  --   --  1.6  --   PHOS  --   --  3.8  --    Liver Function Tests:  Recent Labs Lab 10/20/14 1531 10/21/14 0156  AST 18 13  ALT 10 8  ALKPHOS 110 89  BILITOT 0.5 0.4  PROT 9.1* 7.5  ALBUMIN 4.6 3.9    Recent Labs Lab 10/20/14 1906  LIPASE 54  No results for input(s): AMMONIA in the last 168 hours. CBC:  Recent Labs Lab 10/20/14 1531 10/21/14 0156  WBC 8.7 7.7  NEUTROABS 4.8  --   HGB 16.5* 13.3  HCT 46.3* 38.3  MCV 82.2 83.1  PLT 448* 408*   Cardiac Enzymes:  Recent Labs Lab 10/20/14 1906 10/21/14 0156 10/21/14 0650  TROPONINI <0.30 <0.30 <0.30   BNP: BNP (last 3 results) No results for input(s): PROBNP in the last 8760 hours. CBG:  Recent Labs Lab 10/21/14 0707 10/21/14 0808 10/21/14 0942 10/21/14 1102 10/21/14 1511  GLUCAP 147* 233* 155* 143* 184*       Signed:  Bettyanne Dittman  [facility] 10/25/2014, 6:56 PM

## 2014-11-04 ENCOUNTER — Encounter (HOSPITAL_COMMUNITY): Payer: Self-pay | Admitting: Emergency Medicine

## 2014-11-04 ENCOUNTER — Emergency Department (HOSPITAL_COMMUNITY)
Admission: EM | Admit: 2014-11-04 | Discharge: 2014-11-04 | Disposition: A | Discharge status: Home / Self Care | Payer: Medicaid Other | Attending: Emergency Medicine | Admitting: Emergency Medicine

## 2014-11-04 DIAGNOSIS — M79604 Pain in right leg: Secondary | ICD-10-CM | POA: Diagnosis not present

## 2014-11-04 DIAGNOSIS — F419 Anxiety disorder, unspecified: Secondary | ICD-10-CM | POA: Diagnosis not present

## 2014-11-04 DIAGNOSIS — R202 Paresthesia of skin: Secondary | ICD-10-CM | POA: Insufficient documentation

## 2014-11-04 DIAGNOSIS — Z880 Allergy status to penicillin: Secondary | ICD-10-CM | POA: Insufficient documentation

## 2014-11-04 DIAGNOSIS — Z91040 Latex allergy status: Secondary | ICD-10-CM | POA: Insufficient documentation

## 2014-11-04 DIAGNOSIS — Z87891 Personal history of nicotine dependence: Secondary | ICD-10-CM | POA: Diagnosis not present

## 2014-11-04 DIAGNOSIS — R000 Tachycardia, unspecified: Secondary | ICD-10-CM | POA: Diagnosis not present

## 2014-11-04 DIAGNOSIS — Z79899 Other long term (current) drug therapy: Secondary | ICD-10-CM | POA: Insufficient documentation

## 2014-11-04 DIAGNOSIS — Z794 Long term (current) use of insulin: Secondary | ICD-10-CM | POA: Diagnosis not present

## 2014-11-04 DIAGNOSIS — I10 Essential (primary) hypertension: Secondary | ICD-10-CM | POA: Diagnosis not present

## 2014-11-04 DIAGNOSIS — E119 Type 2 diabetes mellitus without complications: Secondary | ICD-10-CM | POA: Insufficient documentation

## 2014-11-04 LAB — CBG MONITORING, ED: Glucose-Capillary: 133 mg/dL — ABNORMAL HIGH (ref 70–99)

## 2014-11-04 LAB — URINALYSIS, ROUTINE W REFLEX MICROSCOPIC
Bilirubin Urine: NEGATIVE
GLUCOSE, UA: NEGATIVE mg/dL
Ketones, ur: 15 mg/dL — AB
Leukocytes, UA: NEGATIVE
Nitrite: NEGATIVE
Protein, ur: 30 mg/dL — AB
SPECIFIC GRAVITY, URINE: 1.015 (ref 1.005–1.030)
Urobilinogen, UA: 0.2 mg/dL (ref 0.0–1.0)
pH: 6 (ref 5.0–8.0)

## 2014-11-04 LAB — URINE MICROSCOPIC-ADD ON

## 2014-11-04 LAB — CBC
HEMATOCRIT: 42.6 % (ref 36.0–46.0)
Hemoglobin: 15 g/dL (ref 12.0–15.0)
MCH: 29.5 pg (ref 26.0–34.0)
MCHC: 35.2 g/dL (ref 30.0–36.0)
MCV: 83.7 fL (ref 78.0–100.0)
Platelets: 424 10*3/uL — ABNORMAL HIGH (ref 150–400)
RBC: 5.09 MIL/uL (ref 3.87–5.11)
RDW: 13.6 % (ref 11.5–15.5)
WBC: 11.6 10*3/uL — AB (ref 4.0–10.5)

## 2014-11-04 LAB — BASIC METABOLIC PANEL
Anion gap: 19 — ABNORMAL HIGH (ref 5–15)
BUN: 13 mg/dL (ref 6–23)
CHLORIDE: 98 meq/L (ref 96–112)
CO2: 25 mEq/L (ref 19–32)
Calcium: 11 mg/dL — ABNORMAL HIGH (ref 8.4–10.5)
Creatinine, Ser: 0.84 mg/dL (ref 0.50–1.10)
GFR calc Af Amer: >90 mL/min (ref >90)
GFR calc non Af Amer: 78 mL/min — ABNORMAL LOW (ref >90)
GLUCOSE: 126 mg/dL — AB (ref 70–99)
POTASSIUM: 3.6 meq/L — AB (ref 3.7–5.3)
Sodium: 142 mEq/L (ref 137–147)

## 2014-11-04 MED ORDER — SODIUM CHLORIDE 0.9 % IV BOLUS (SEPSIS)
1000.0000 mL | Freq: Once | INTRAVENOUS | Status: AC
  Administered 2014-11-04: 1000 mL via INTRAVENOUS

## 2014-11-04 MED ORDER — LORAZEPAM 2 MG/ML IJ SOLN
1.0000 mg | Freq: Once | INTRAMUSCULAR | Status: AC
  Administered 2014-11-04: 1 mg via INTRAVENOUS
  Filled 2014-11-04: qty 1

## 2014-11-04 MED ORDER — MORPHINE SULFATE 4 MG/ML IJ SOLN
4.0000 mg | Freq: Once | INTRAMUSCULAR | Status: AC
  Administered 2014-11-04: 4 mg via INTRAVENOUS
  Filled 2014-11-04: qty 1

## 2014-11-04 MED ORDER — OXYCODONE-ACETAMINOPHEN 5-325 MG PO TABS
1.0000 | ORAL_TABLET | Freq: Once | ORAL | Status: AC
  Administered 2014-11-04: 1 via ORAL
  Filled 2014-11-04: qty 1

## 2014-11-04 MED ORDER — ONDANSETRON HCL 4 MG/2ML IJ SOLN
4.0000 mg | Freq: Once | INTRAMUSCULAR | Status: AC
  Administered 2014-11-04: 4 mg via INTRAVENOUS
  Filled 2014-11-04: qty 2

## 2014-11-04 NOTE — ED Provider Notes (Signed)
CSN: [id number]     Arrival date & time 11/04/14  1323 History   First MD Initiated Contact with Patient 11/04/14 1503     Chief Complaint  Patient presents with  . Anxiety     (Consider location/radiation/quality/duration/timing/severity/associated sxs/prior Treatment) HPI  Pt presenting with c/o anxiety, feeling heart racing.  She states that her right leg has been hurting from a prior injury and she feels this is what caused her to feel more anxious.  Pt hyperventilating and tearful.  She states she has hx of similar symptoms of anxiety in the past- does not take meds for this.  No chest pain.  No syncope.  Pt states she feels tingling in both hands.  No leg swelling.  Denies SI/HI.  There are no other associated systemic symptoms, there are no other alleviating or modifying factors.   Past Medical History  Diagnosis Date  . Hypertension   . Diabetes mellitus without complication    Past Surgical History  Procedure Laterality Date  . Hysteroscopy w/d&c N/A 06/11/2013    Procedure: DILATATION AND CURETTAGE /HYSTEROSCOPY/myomyectomy;  Surgeon: Emily Filbert, MD;  Location: [facility];  Service: Gynecology;  Laterality: N/A;   Family History  Problem Relation Age of Onset  . CAD Mother   . Kidney failure Mother   . Diabetes type II Mother   . Hypertension Mother   . CAD Father   . Diabetes type II Father   . Hypertension Father   . Diabetes type II Brother    History  Substance Use Topics  . Smoking status: Former Smoker -- 0.25 packs/day for 20 years    Types: Cigarettes  . Smokeless tobacco: Not on file  . Alcohol Use: No   OB History    Gravida Para Term Preterm AB TAB SAB Ectopic Multiple Living   0              Review of Systems  ROS reviewed and all otherwise negative except for mentioned in HPI    Allergies  Latex and Penicillins  Home Medications   Prior to Admission medications   Medication Sig Start Date End Date Taking? Authorizing Provider   acetaminophen (TYLENOL) 500 MG tablet Take 1,000 mg by mouth every 6 (six) hours as needed for moderate pain (pain).   Yes Historical Provider, MD  amLODipine (NORVASC) 10 MG tablet Take 10 mg by mouth daily.   Yes Historical Provider, MD  atenolol (TENORMIN) 50 MG tablet Take 50 mg by mouth daily.   Yes Historical Provider, MD  ferrous sulfate 325 (65 FE) MG tablet Take 325 mg by mouth daily at 12 noon.    Yes Historical Provider, MD  gabapentin (NEURONTIN) 800 MG tablet Take 1,600 mg by mouth 2 (two) times daily.   Yes Historical Provider, MD  glipiZIDE (GLUCOTROL) 10 MG tablet Take 10 mg by mouth 2 (two) times daily before a meal.    Yes Historical Provider, MD  Insulin Glargine (LANTUS SOLOSTAR) 100 UNIT/ML Solostar Pen Inject 10 Units into the skin daily at 10 pm. 10/21/14  Yes Hosie Poisson, MD  insulin lispro (HUMALOG) 100 UNIT/ML injection Inject 0-15 Units into the skin 3 (three) times daily before meals.   Yes Historical Provider, MD  metFORMIN (GLUCOPHAGE) 850 MG tablet Take 850 mg by mouth 2 (two) times daily with a meal.   Yes Historical Provider, MD  ramipril (ALTACE) 2.5 MG capsule Take 1 capsule (2.5 mg total) by mouth daily. 10/21/14  Yes Hosie Poisson,  MD  insulin aspart (NOVOLOG) 100 UNIT/ML injection Inject 0-15 Units into the skin 3 (three) times daily with meals. Patient not taking: Reported on 11/04/2014 10/21/14   Hosie Poisson, MD  levofloxacin (LEVAQUIN) 500 MG tablet Take 1 tablet (500 mg total) by mouth daily. Patient not taking: Reported on 11/04/2014 10/21/14   Hosie Poisson, MD  promethazine (PHENERGAN) 25 MG tablet Take 1 tablet (25 mg total) by mouth every 6 (six) hours as needed for nausea or vomiting. 05/28/14   Ernestina Patches, MD   BP 116/88 mmHg  Pulse 99  Temp(Src) 99.6 F (37.6 C) (Oral)  Resp 14  SpO2 99%  Vitals reviewed Physical Exam  Physical Examination: General appearance - alert, anxious appearing, and in no distress Mental status - alert, oriented  to person, place, and time Eyes - no conjunctival injection, no scleral icterus Mouth - mucous membranes moist, pharynx normal without lesions Chest - clear to auscultation, no wheezes, rales or rhonchi, symmetric air entry Heart - normal rate, regular rhythm, normal S1, S2, no murmurs, rubs, clicks or gallops Abdomen - soft, nontender, nondistended, no masses or organomegaly Extremities - peripheral pulses normal, no pedal edema, no clubbing or cyanosis Skin - normal coloration and turgor, no rashes Psych- anxious appearing, tearful  ED Course  Procedures (including critical care time)  5:14 PM pt is feeling much improved, she has no further pain, anxiety is improved,  She is still tachycardic, so will continue to give IV fluids.  Labs Review Labs Reviewed  CBC - Abnormal; Notable for the following:    WBC 11.6 (*)    Platelets 424 (*)    All other components within normal limits  BASIC METABOLIC PANEL - Abnormal; Notable for the following:    Potassium 3.6 (*)    Glucose, Bld 126 (*)    Calcium 11.0 (*)    GFR calc non Af Amer 78 (*)    Anion gap 19 (*)    All other components within normal limits  URINALYSIS, ROUTINE W REFLEX MICROSCOPIC - Abnormal; Notable for the following:    Hgb urine dipstick TRACE (*)    Ketones, ur 15 (*)    Protein, ur 30 (*)    All other components within normal limits  URINE MICROSCOPIC-ADD ON - Abnormal; Notable for the following:    Squamous Epithelial / LPF FEW (*)    Casts HYALINE CASTS (*)    All other components within normal limits  CBG MONITORING, ED - Abnormal; Notable for the following:    Glucose-Capillary 133 (*)    All other components within normal limits    Imaging Review No results found.   EKG Interpretation   Date/Time:  Thursday November 04 2014 15:39:21 EST Ventricular Rate:  112 PR Interval:  127 QRS Duration: 79 QT Interval:  307 QTC Calculation: 419 R Axis:   56 Text Interpretation:  Sinus tachycardia  Borderline repolarization  abnormality Since previous tracing rate faster Confirmed by Canary Brim  MD,  Maxamillian Tienda [phone]) on 11/04/2014 5:19:43 PM      MDM   Final diagnoses:  Anxiety  Tachycardia    Pt presenting with recurrence of anxiety which is familiar symptom for her.  She c/o right leg pain as well.  Doubt DVT- no swelling- she states she has chronic pain in that leg after a prior injury with plates/screws- no new injury.  No chest pain.  Pt feels improved after pain and anxiety meds. Doubt PE as patient states her symptoms are the  exact same as prior panic attacks.  HR normalizes on monitor and then increases specifically when patient becomes tearful and feels more anxious.  Discharged with strict return precautions.  Pt agreeable with plan.   Threasa Beards, MD 11/04/14 [phone]

## 2014-11-04 NOTE — ED Notes (Addendum)
Per ems pt from home co of palpitation, HX of anxiety, no meds for this. Pt presents with hyperventilation and HR of 122.  Hx of HTN, diabetes. Pt is alert and oriented. Pt also co right leg pain. Tingling bilateral hands. Pt received 4 mg Zofran by ems.

## 2014-11-04 NOTE — Discharge Instructions (Signed)
Return to the ED with any concerns including difficulty breathing, chest pain, fainting, thoughts or feeling of suicide or homicide, decreased level of alertness/lethargy, or any other alarming symptoms

## 2014-11-04 NOTE — ED Notes (Signed)
Bed: VW86 Expected date:  Expected time:  Means of arrival:  Comments: anxiety

## 2015-03-15 ENCOUNTER — Emergency Department (HOSPITAL_COMMUNITY)
Admission: EM | Admit: 2015-03-15 | Discharge: 2015-03-15 | Disposition: A | Discharge status: Home / Self Care | Payer: Medicaid Other | Attending: Emergency Medicine | Admitting: Emergency Medicine

## 2015-03-15 ENCOUNTER — Encounter (HOSPITAL_COMMUNITY): Payer: Self-pay | Admitting: Emergency Medicine

## 2015-03-15 DIAGNOSIS — Z79899 Other long term (current) drug therapy: Secondary | ICD-10-CM | POA: Insufficient documentation

## 2015-03-15 DIAGNOSIS — Z87891 Personal history of nicotine dependence: Secondary | ICD-10-CM | POA: Diagnosis not present

## 2015-03-15 DIAGNOSIS — Z91040 Latex allergy status: Secondary | ICD-10-CM | POA: Diagnosis not present

## 2015-03-15 DIAGNOSIS — I10 Essential (primary) hypertension: Secondary | ICD-10-CM | POA: Diagnosis not present

## 2015-03-15 DIAGNOSIS — Z880 Allergy status to penicillin: Secondary | ICD-10-CM | POA: Diagnosis not present

## 2015-03-15 DIAGNOSIS — R112 Nausea with vomiting, unspecified: Secondary | ICD-10-CM | POA: Diagnosis not present

## 2015-03-15 DIAGNOSIS — Z794 Long term (current) use of insulin: Secondary | ICD-10-CM | POA: Diagnosis not present

## 2015-03-15 DIAGNOSIS — F419 Anxiety disorder, unspecified: Secondary | ICD-10-CM | POA: Diagnosis not present

## 2015-03-15 DIAGNOSIS — E876 Hypokalemia: Secondary | ICD-10-CM | POA: Diagnosis not present

## 2015-03-15 DIAGNOSIS — E119 Type 2 diabetes mellitus without complications: Secondary | ICD-10-CM | POA: Diagnosis not present

## 2015-03-15 LAB — BASIC METABOLIC PANEL
Anion gap: 13 (ref 5–15)
BUN: 14 mg/dL (ref 6–23)
CALCIUM: 9.8 mg/dL (ref 8.4–10.5)
CO2: 25 mmol/L (ref 19–32)
Chloride: 100 mmol/L (ref 96–112)
Creatinine, Ser: 0.91 mg/dL (ref 0.50–1.10)
GFR, EST AFRICAN AMERICAN: 81 mL/min — AB (ref >90)
GFR, EST NON AFRICAN AMERICAN: 70 mL/min — AB (ref >90)
Glucose, Bld: 144 mg/dL — ABNORMAL HIGH (ref 70–99)
POTASSIUM: 3.1 mmol/L — AB (ref 3.5–5.1)
Sodium: 138 mmol/L (ref 135–145)

## 2015-03-15 MED ORDER — ONDANSETRON HCL 4 MG/2ML IJ SOLN
4.0000 mg | Freq: Once | INTRAMUSCULAR | Status: AC
  Administered 2015-03-15: 4 mg via INTRAVENOUS
  Filled 2015-03-15: qty 2

## 2015-03-15 MED ORDER — ONDANSETRON 4 MG PO TBDP: 4.0000 mg | ORAL_TABLET | Freq: Three times a day (TID) | ORAL | Status: DC | PRN

## 2015-03-15 MED ORDER — SODIUM CHLORIDE 0.9 % IV BOLUS (SEPSIS)
1000.0000 mL | Freq: Once | INTRAVENOUS | Status: AC
  Administered 2015-03-15: 1000 mL via INTRAVENOUS

## 2015-03-15 MED ORDER — POTASSIUM CHLORIDE ER 10 MEQ PO TBCR: 10.0000 meq | EXTENDED_RELEASE_TABLET | Freq: Two times a day (BID) | ORAL | Status: DC

## 2015-03-15 NOTE — ED Notes (Signed)
Per EMS pt with Hx of anxiety called for hyperventilation and emesis, pt states she has Rx for zofran and anxiolitic medications, but is currently out of medications until her refill is delivered tomorrow. 4 mg zofran IV given en route, some relief.

## 2015-03-15 NOTE — ED Notes (Signed)
MD at bedside. 

## 2015-03-15 NOTE — ED Notes (Signed)
Bed: TZ00 Expected date:  Expected time:  Means of arrival:  Comments: EMS-nausea, anxiety

## 2015-03-15 NOTE — Discharge Instructions (Signed)
Hypokalemia Hypokalemia means that the amount of potassium in the blood is lower than normal.Potassium is a chemical, called an electrolyte, that helps regulate the amount of fluid in the body. It also stimulates muscle contraction and helps nerves function properly.Most of the body's potassium is inside of cells, and only a very small amount is in the blood. Because the amount in the blood is so small, minor changes can be life-threatening. CAUSES  Antibiotics.  Diarrhea or vomiting.  Using laxatives too much, which can cause diarrhea.  Chronic kidney disease.  Water pills (diuretics).  Eating disorders (bulimia).  Low magnesium level.  Sweating a lot. SIGNS AND SYMPTOMS  Weakness.  Constipation.  Fatigue.  Muscle cramps.  Mental confusion.  Skipped heartbeats or irregular heartbeat (palpitations).  Tingling or numbness. DIAGNOSIS  Your health care provider can diagnose hypokalemia with blood tests. In addition to checking your potassium level, your health care provider may also check other lab tests. TREATMENT Hypokalemia can be treated with potassium supplements taken by mouth or adjustments in your current medicines. If your potassium level is very low, you may need to get potassium through a vein (IV) and be monitored in the hospital. A diet high in potassium is also helpful. Foods high in potassium are:  Nuts, such as peanuts and pistachios.  Seeds, such as sunflower seeds and pumpkin seeds.  Peas, lentils, and lima beans.  Whole grain and bran cereals and breads.  Fresh fruit and vegetables, such as apricots, avocado, bananas, cantaloupe, kiwi, oranges, tomatoes, asparagus, and potatoes.  Orange and tomato juices.  Red meats.  Fruit yogurt. HOME CARE INSTRUCTIONS  Take all medicines as prescribed by your health care provider.  Maintain a healthy diet by including nutritious food, such as fruits, vegetables, nuts, whole grains, and lean meats.  If  you are taking a laxative, be sure to follow the directions on the label. SEEK MEDICAL CARE IF:  Your weakness gets worse.  You feel your heart pounding or racing.  You are vomiting or having diarrhea.  You are diabetic and having trouble keeping your blood glucose in the normal range. SEEK IMMEDIATE MEDICAL CARE IF:  You have chest pain, shortness of breath, or dizziness.  You are vomiting or having diarrhea for more than 2 days.  You faint. MAKE SURE YOU:   Understand these instructions.  Will watch your condition.  Will get help right away if you are not doing well or get worse. Document Released: 11/12/2005 Document Revised: 09/02/2013 Document Reviewed: 05/15/2013 [facility] Patient Information 2015 [place], [place]. This information is not intended to replace advice given to you by your health care provider. Make sure you discuss any questions you have with your health care provider.  Panic Attacks Panic attacks are sudden, short-livedsurges of severe anxiety, fear, or discomfort. They may occur for no reason when you are relaxed, when you are anxious, or when you are sleeping. Panic attacks may occur for a number of reasons:   Healthy people occasionally have panic attacks in extreme, life-threatening situations, such as war or natural disasters. Normal anxiety is a protective mechanism of the body that helps [place] react to danger (fight or flight response).  Panic attacks are often seen with anxiety disorders, such as panic disorder, social anxiety disorder, generalized anxiety disorder, and phobias. Anxiety disorders cause excessive or uncontrollable anxiety. They may interfere with your relationships or other life activities.  Panic attacks are sometimes seen with other mental illnesses, such as depression and posttraumatic stress disorder.  Certain medical conditions, prescription medicines, and drugs of abuse can cause panic attacks. SYMPTOMS  Panic attacks start  suddenly, peak within 20 minutes, and are accompanied by four or more of the following symptoms:  Pounding heart or fast heart rate (palpitations).  Sweating.  Trembling or shaking.  Shortness of breath or feeling smothered.  Feeling choked.  Chest pain or discomfort.  Nausea or strange feeling in your stomach.  Dizziness, light-headedness, or feeling like you will faint.  Chills or hot flushes.  Numbness or tingling in your lips or hands and feet.  Feeling that things are not real or feeling that you are not yourself.  Fear of losing control or going crazy.  Fear of dying. Some of these symptoms can mimic serious medical conditions. For example, you may think you are having a heart attack. Although panic attacks can be very scary, they are not life threatening. DIAGNOSIS  Panic attacks are diagnosed through an assessment by your health care provider. Your health care provider will ask questions about your symptoms, such as where and when they occurred. Your health care provider will also ask about your medical history and use of alcohol and drugs, including prescription medicines. Your health care provider may order blood tests or other studies to rule out a serious medical condition. Your health care provider may refer you to a mental health professional for further evaluation. TREATMENT   Most healthy people who have one or two panic attacks in an extreme, life-threatening situation will not require treatment.  The treatment for panic attacks associated with anxiety disorders or other mental illness typically involves counseling with a mental health professional, medicine, or a combination of both. Your health care provider will help determine what treatment is best for you.  Panic attacks due to physical illness usually go away with treatment of the illness. If prescription medicine is causing panic attacks, talk with your health care provider about stopping the medicine,  decreasing the dose, or substituting another medicine.  Panic attacks due to alcohol or drug abuse go away with abstinence. Some adults need professional help in order to stop drinking or using drugs. HOME CARE INSTRUCTIONS   Take all medicines as directed by your health care provider.   Schedule and attend follow-up visits as directed by your health care provider. It is important to keep all your appointments. SEEK MEDICAL CARE IF:  You are not able to take your medicines as prescribed.  Your symptoms do not improve or get worse. SEEK IMMEDIATE MEDICAL CARE IF:   You experience panic attack symptoms that are different than your usual symptoms.  You have serious thoughts about hurting yourself or others.  You are taking medicine for panic attacks and have a serious side effect. MAKE SURE YOU:  Understand these instructions.  Will watch your condition.  Will get help right away if you are not doing well or get worse. Document Released: 11/12/2005 Document Revised: 11/17/2013 Document Reviewed: 06/26/2013 [facility] Patient Information 2015 [place], [place]. This information is not intended to replace advice given to you by your health care provider. Make sure you discuss any questions you have with your health care provider.  Nausea and Vomiting Nausea is a sick feeling that often comes before throwing up (vomiting). Vomiting is a reflex where stomach contents come out of your mouth. Vomiting can cause severe loss of body fluids (dehydration). Children and elderly adults can become dehydrated quickly, especially if they also have diarrhea. Nausea and vomiting are symptoms of  a condition or disease. It is important to find the cause of your symptoms. CAUSES   Direct irritation of the stomach lining. This irritation can result from increased acid production (gastroesophageal reflux disease), infection, food poisoning, taking certain medicines (such as nonsteroidal anti-inflammatory  drugs), alcohol use, or tobacco use.  Signals from the brain.These signals could be caused by a headache, heat exposure, an inner ear disturbance, increased pressure in the brain from injury, infection, a tumor, or a concussion, pain, emotional stimulus, or metabolic problems.  An obstruction in the gastrointestinal tract (bowel obstruction).  Illnesses such as diabetes, hepatitis, gallbladder problems, appendicitis, kidney problems, cancer, sepsis, atypical symptoms of a heart attack, or eating disorders.  Medical treatments such as chemotherapy and radiation.  Receiving medicine that makes you sleep (general anesthetic) during surgery. DIAGNOSIS Your caregiver may ask for tests to be done if the problems do not improve after a few days. Tests may also be done if symptoms are severe or if the reason for the nausea and vomiting is not clear. Tests may include:  Urine tests.  Blood tests.  Stool tests.  Cultures (to look for evidence of infection).  X-rays or other imaging studies. Test results can help your caregiver make decisions about treatment or the need for additional tests. TREATMENT You need to stay well hydrated. Drink frequently but in small amounts.You may wish to drink water, sports drinks, clear broth, or eat frozen ice pops or gelatin dessert to help stay hydrated.When you eat, eating slowly may help prevent nausea.There are also some antinausea medicines that may help prevent nausea. HOME CARE INSTRUCTIONS   Take all medicine as directed by your caregiver.  If you do not have an appetite, do not force yourself to eat. However, you must continue to drink fluids.  If you have an appetite, eat a normal diet unless your caregiver tells you differently.  Eat a variety of complex carbohydrates (rice, wheat, potatoes, bread), lean meats, yogurt, fruits, and vegetables.  Avoid high-fat foods because they are more difficult to digest.  Drink enough water and fluids to  keep your urine clear or pale yellow.  If you are dehydrated, ask your caregiver for specific rehydration instructions. Signs of dehydration may include:  Severe thirst.  Dry lips and mouth.  Dizziness.  Dark urine.  Decreasing urine frequency and amount.  Confusion.  Rapid breathing or pulse. SEEK IMMEDIATE MEDICAL CARE IF:   You have blood or brown flecks (like coffee grounds) in your vomit.  You have black or bloody stools.  You have a severe headache or stiff neck.  You are confused.  You have severe abdominal pain.  You have chest pain or trouble breathing.  You do not urinate at least once every 8 hours.  You develop cold or clammy skin.  You continue to vomit for longer than 24 to 48 hours.  You have a fever. MAKE SURE YOU:   Understand these instructions.  Will watch your condition.  Will get help right away if you are not doing well or get worse. Document Released: 11/12/2005 Document Revised: 02/04/2012 Document Reviewed: 04/11/2011 [facility] Patient Information 2015 [place], [place]. This information is not intended to replace advice given to you by your health care provider. Make sure you discuss any questions you have with your health care provider.

## 2015-03-15 NOTE — ED Provider Notes (Signed)
CSN: [id number]     Arrival date & time 03/15/15  1251 History   First MD Initiated Contact with Patient 03/15/15 1340     Chief Complaint  Patient presents with  . Anxiety     HPI  Patient presents for evaluation of panic attack, and nausea. She states that she has nausea yesterday and today. She vomited yesterday and this morning. Feels better now. States that she started hyperventilating at home and got quite anxious. She was able to calm and slow herself down. Now she states her nausea feels "quite a bit better". This has happened her in the past. Has been on anti-anxiety medications. Scheduled for delivery tomorrow. Has been off for the last 2-3 days.  Sugars been under good control. She is not taken her noon insulin because she has not had lunch. 160 this morning.  Past Medical History  Diagnosis Date  . Hypertension   . Diabetes mellitus without complication    Past Surgical History  Procedure Laterality Date  . Hysteroscopy w/d&c N/A 06/11/2013    Procedure: DILATATION AND CURETTAGE /HYSTEROSCOPY/myomyectomy;  Surgeon: Emily Filbert, MD;  Location: [facility];  Service: Gynecology;  Laterality: N/A;   Family History  Problem Relation Age of Onset  . CAD Mother   . Kidney failure Mother   . Diabetes type II Mother   . Hypertension Mother   . CAD Father   . Diabetes type II Father   . Hypertension Father   . Diabetes type II Brother    History  Substance Use Topics  . Smoking status: Former Smoker -- 0.25 packs/day for 20 years    Types: Cigarettes  . Smokeless tobacco: Not on file  . Alcohol Use: No   OB History    Gravida Para Term Preterm AB TAB SAB Ectopic Multiple Living   0              Review of Systems  Constitutional: Negative for fever, chills, diaphoresis, appetite change and fatigue.  HENT: Negative for mouth sores, sore throat and trouble swallowing.   Eyes: Negative for visual disturbance.  Respiratory: Negative for cough, chest tightness, shortness of  breath and wheezing.   Cardiovascular: Negative for chest pain.  Gastrointestinal: Positive for nausea and vomiting. Negative for abdominal pain, diarrhea and abdominal distention.  Endocrine: Negative for polydipsia, polyphagia and polyuria.  Genitourinary: Negative for dysuria, frequency and hematuria.  Musculoskeletal: Negative for gait problem.  Skin: Negative for color change, pallor and rash.  Neurological: Negative for dizziness, syncope, light-headedness and headaches.  Hematological: Does not bruise/bleed easily.  Psychiatric/Behavioral: Negative for behavioral problems and confusion.      Allergies  Latex; Morphine and related; and Penicillins  Home Medications   Prior to Admission medications   Medication Sig Start Date End Date Taking? Authorizing Provider  acetaminophen (TYLENOL) 500 MG tablet Take 1,000 mg by mouth every 6 (six) hours as needed for moderate pain (pain).   Yes Historical Provider, MD  amLODipine (NORVASC) 10 MG tablet Take 10 mg by mouth daily.   Yes Historical Provider, MD  atenolol (TENORMIN) 50 MG tablet Take 50 mg by mouth daily.   Yes Historical Provider, MD  diphenhydrAMINE (BENADRYL) 25 MG tablet Take 50 mg by mouth daily as needed for allergies.   Yes Historical Provider, MD  ferrous sulfate 325 (65 FE) MG tablet Take 325 mg by mouth daily at 12 noon.    Yes Historical Provider, MD  gabapentin (NEURONTIN) 800 MG tablet Take 800  mg by mouth 3 (three) times daily.    Yes Historical Provider, MD  glipiZIDE (GLUCOTROL) 10 MG tablet Take 10 mg by mouth daily before breakfast.    Yes Historical Provider, MD  hydrochlorothiazide (HYDRODIURIL) 25 MG tablet Take 25 mg by mouth daily.   Yes Historical Provider, MD  hydrOXYzine (ATARAX/VISTARIL) 25 MG tablet Take 25 mg by mouth at bedtime as needed for anxiety or itching.   Yes Historical Provider, MD  Insulin Glargine (LANTUS SOLOSTAR) 100 UNIT/ML Solostar Pen Inject 10 Units into the skin daily at 10  pm. Patient taking differently: Inject 11 Units into the skin daily at 10 pm.  10/21/14  Yes Hosie Poisson, MD  insulin lispro (HUMALOG) 100 UNIT/ML injection Inject 0-15 Units into the skin 3 (three) times daily before meals.   Yes Historical Provider, MD  metFORMIN (GLUCOPHAGE) 1000 MG tablet Take 1,000 mg by mouth 2 (two) times daily with a meal.   Yes Historical Provider, MD  Omega-3 Fatty Acids (OMEGA-3 FISH OIL PO) Take 1 capsule by mouth 2 (two) times daily with breakfast and lunch.   Yes Historical Provider, MD  ondansetron (ZOFRAN) 4 MG tablet Take 1 tablet by mouth 3 (three) times daily as needed. 02/17/15  Yes Historical Provider, MD  sertraline (ZOLOFT) 50 MG tablet Take 50 mg by mouth daily. 01/25/15  Yes Historical Provider, MD  insulin aspart (NOVOLOG) 100 UNIT/ML injection Inject 0-15 Units into the skin 3 (three) times daily with meals. Patient not taking: Reported on 11/04/2014 10/21/14   Hosie Poisson, MD  levofloxacin (LEVAQUIN) 500 MG tablet Take 1 tablet (500 mg total) by mouth daily. Patient not taking: Reported on 11/04/2014 10/21/14   Hosie Poisson, MD  metFORMIN (GLUCOPHAGE) 850 MG tablet Take 850 mg by mouth 2 (two) times daily with a meal.    Historical Provider, MD  ondansetron (ZOFRAN ODT) 4 MG disintegrating tablet Take 1 tablet (4 mg total) by mouth every 8 (eight) hours as needed for nausea. 03/15/15   Tanna Furry, MD  potassium chloride (K-DUR) 10 MEQ tablet Take 1 tablet (10 mEq total) by mouth 2 (two) times daily. 03/15/15   Tanna Furry, MD  promethazine (PHENERGAN) 25 MG tablet Take 1 tablet (25 mg total) by mouth every 6 (six) hours as needed for nausea or vomiting. Patient not taking: Reported on 03/15/2015 05/28/14   Ernestina Patches, MD  ramipril (ALTACE) 2.5 MG capsule Take 1 capsule (2.5 mg total) by mouth daily. Patient not taking: Reported on 03/15/2015 10/21/14   Hosie Poisson, MD   BP 140/78 mmHg  Pulse 89  Temp(Src) 98 F (36.7 C) (Oral)  Resp 13  SpO2  100% Physical Exam  Constitutional: She is oriented to person, place, and time. She appears well-developed and well-nourished. No distress.  HENT:  Head: Normocephalic.  Eyes: Conjunctivae are normal. Pupils are equal, round, and reactive to light. No scleral icterus.  Neck: Normal range of motion. Neck supple. No thyromegaly present.  Cardiovascular: Normal rate and regular rhythm.  Exam reveals no gallop and no friction rub.   No murmur heard. Pulmonary/Chest: Effort normal and breath sounds normal. No respiratory distress. She has no wheezes. She has no rales.  Abdominal: Soft. Bowel sounds are normal. She exhibits no distension. There is no tenderness. There is no rebound.  Musculoskeletal: Normal range of motion.  Neurological: She is alert and oriented to person, place, and time.  Skin: Skin is warm and dry. No rash noted.  Psychiatric: She has a normal  mood and affect. Her behavior is normal.    ED Course  Procedures (including critical care time) Labs Review Labs Reviewed  BASIC METABOLIC PANEL - Abnormal; Notable for the following:    Potassium 3.1 (*)    Glucose, Bld 144 (*)    GFR calc non Af Amer 70 (*)    GFR calc Af Amer 81 (*)    All other components within normal limits    Imaging Review No results found.   EKG Interpretation None      MDM   Final diagnoses:  Non-intractable vomiting with nausea, vomiting of unspecified type  Anxiety  Hypokalemia    Patient taking by mouth. Clinically appears well. Calm. Discharge with Zofran. Recheck as needed.    Tanna Furry, MD 03/15/15 [phone]

## 2015-07-27 ENCOUNTER — Emergency Department (HOSPITAL_COMMUNITY): Payer: Medicaid Other

## 2015-07-27 ENCOUNTER — Encounter (HOSPITAL_COMMUNITY): Payer: Self-pay | Admitting: Emergency Medicine

## 2015-07-27 ENCOUNTER — Emergency Department (HOSPITAL_COMMUNITY)
Admission: EM | Admit: 2015-07-27 | Discharge: 2015-07-28 | Disposition: A | Discharge status: Home / Self Care | Payer: Medicaid Other | Attending: Emergency Medicine | Admitting: Emergency Medicine

## 2015-07-27 DIAGNOSIS — Z91040 Latex allergy status: Secondary | ICD-10-CM | POA: Diagnosis not present

## 2015-07-27 DIAGNOSIS — R1084 Generalized abdominal pain: Secondary | ICD-10-CM | POA: Diagnosis not present

## 2015-07-27 DIAGNOSIS — I10 Essential (primary) hypertension: Secondary | ICD-10-CM | POA: Insufficient documentation

## 2015-07-27 DIAGNOSIS — R1012 Left upper quadrant pain: Secondary | ICD-10-CM | POA: Diagnosis present

## 2015-07-27 DIAGNOSIS — Z79899 Other long term (current) drug therapy: Secondary | ICD-10-CM | POA: Diagnosis not present

## 2015-07-27 DIAGNOSIS — R112 Nausea with vomiting, unspecified: Secondary | ICD-10-CM | POA: Diagnosis not present

## 2015-07-27 DIAGNOSIS — Z794 Long term (current) use of insulin: Secondary | ICD-10-CM | POA: Insufficient documentation

## 2015-07-27 DIAGNOSIS — Z880 Allergy status to penicillin: Secondary | ICD-10-CM | POA: Diagnosis not present

## 2015-07-27 DIAGNOSIS — E119 Type 2 diabetes mellitus without complications: Secondary | ICD-10-CM | POA: Diagnosis not present

## 2015-07-27 DIAGNOSIS — R630 Anorexia: Secondary | ICD-10-CM | POA: Insufficient documentation

## 2015-07-27 LAB — URINALYSIS, ROUTINE W REFLEX MICROSCOPIC
Glucose, UA: NEGATIVE mg/dL
Ketones, ur: 40 mg/dL — AB
NITRITE: NEGATIVE
Specific Gravity, Urine: 1.027 (ref 1.005–1.030)
UROBILINOGEN UA: 0.2 mg/dL (ref 0.0–1.0)
pH: 5.5 (ref 5.0–8.0)

## 2015-07-27 LAB — CBC WITH DIFFERENTIAL/PLATELET
BASOS ABS: 0 10*3/uL (ref 0.0–0.1)
Basophils Relative: 0 % (ref 0–1)
Eosinophils Absolute: 0.1 10*3/uL (ref 0.0–0.7)
Eosinophils Relative: 1 % (ref 0–5)
HEMATOCRIT: 39.9 % (ref 36.0–46.0)
Hemoglobin: 14.1 g/dL (ref 12.0–15.0)
Lymphocytes Relative: 40 % (ref 12–46)
Lymphs Abs: 3.6 10*3/uL (ref 0.7–4.0)
MCH: 29.3 pg (ref 26.0–34.0)
MCHC: 35.3 g/dL (ref 30.0–36.0)
MCV: 82.8 fL (ref 78.0–100.0)
Monocytes Absolute: 0.8 10*3/uL (ref 0.1–1.0)
Monocytes Relative: 9 % (ref 3–12)
NEUTROS ABS: 4.5 10*3/uL (ref 1.7–7.7)
Neutrophils Relative %: 50 % (ref 43–77)
PLATELETS: 446 10*3/uL — AB (ref 150–400)
RBC: 4.82 MIL/uL (ref 3.87–5.11)
RDW: 13.3 % (ref 11.5–15.5)
WBC: 9 10*3/uL (ref 4.0–10.5)

## 2015-07-27 LAB — URINE MICROSCOPIC-ADD ON

## 2015-07-27 LAB — I-STAT TROPONIN, ED: TROPONIN I, POC: 0 ng/mL (ref 0.00–0.08)

## 2015-07-27 LAB — BASIC METABOLIC PANEL
ANION GAP: 15 (ref 5–15)
BUN: 16 mg/dL (ref 6–20)
CO2: 20 mmol/L — AB (ref 22–32)
Calcium: 10 mg/dL (ref 8.9–10.3)
Chloride: 100 mmol/L — ABNORMAL LOW (ref 101–111)
Creatinine, Ser: 0.95 mg/dL (ref 0.44–1.00)
GFR calc Af Amer: >60 mL/min (ref >60)
GLUCOSE: 214 mg/dL — AB (ref 65–99)
POTASSIUM: 3.5 mmol/L (ref 3.5–5.1)
Sodium: 135 mmol/L (ref 135–145)

## 2015-07-27 LAB — HEPATIC FUNCTION PANEL
ALBUMIN: 4.7 g/dL (ref 3.5–5.0)
ALT: 17 U/L (ref 14–54)
AST: 22 U/L (ref 15–41)
Alkaline Phosphatase: 99 U/L (ref 38–126)
BILIRUBIN TOTAL: 0.9 mg/dL (ref 0.3–1.2)
Bilirubin, Direct: <0.1 mg/dL — ABNORMAL LOW (ref 0.1–0.5)
TOTAL PROTEIN: 8.5 g/dL — AB (ref 6.5–8.1)

## 2015-07-27 LAB — LIPASE, BLOOD: LIPASE: 24 U/L (ref 22–51)

## 2015-07-27 MED ORDER — ONDANSETRON HCL 4 MG/2ML IJ SOLN
4.0000 mg | Freq: Once | INTRAMUSCULAR | Status: AC
  Administered 2015-07-27: 4 mg via INTRAVENOUS
  Filled 2015-07-27: qty 2

## 2015-07-27 MED ORDER — DIPHENHYDRAMINE HCL 50 MG/ML IJ SOLN
25.0000 mg | Freq: Once | INTRAMUSCULAR | Status: AC
  Administered 2015-07-27: 25 mg via INTRAVENOUS
  Filled 2015-07-27: qty 1

## 2015-07-27 MED ORDER — HYDROMORPHONE HCL 1 MG/ML IJ SOLN
1.0000 mg | Freq: Once | INTRAMUSCULAR | Status: AC
  Administered 2015-07-27: 1 mg via INTRAVENOUS
  Filled 2015-07-27: qty 1

## 2015-07-27 MED ORDER — IOHEXOL 300 MG/ML  SOLN
50.0000 mL | Freq: Once | INTRAMUSCULAR | Status: AC | PRN
  Administered 2015-07-28: 50 mL via INTRAVENOUS

## 2015-07-27 NOTE — ED Provider Notes (Signed)
CSN: [id number]     Arrival date & time 07/27/15  1801 History   First MD Initiated Contact with Patient 07/27/15 1816     Chief Complaint  Patient presents with  . Abdominal Pain  . Nausea  . Emesis     (Consider location/radiation/quality/duration/timing/severity/associated sxs/prior Treatment) Patient is a 54 y.o. female presenting with abdominal pain and vomiting.  Abdominal Pain Pain location:  LUQ and epigastric Pain quality: cramping and sharp   Pain radiates to:  Does not radiate Pain severity:  Moderate Onset quality:  Gradual Duration:  3 days Timing:  Constant Progression:  Unchanged Chronicity:  Recurrent Context comment:  Last BM 4 days ago Relieved by:  Nothing Worsened by:  Nothing tried Ineffective treatments:  None tried Associated symptoms: anorexia and vomiting   Associated symptoms: no cough   Emesis Associated symptoms: abdominal pain     Past Medical History  Diagnosis Date  . Hypertension   . Diabetes mellitus without complication    Past Surgical History  Procedure Laterality Date  . Hysteroscopy w/d&c N/A 06/11/2013    Procedure: DILATATION AND CURETTAGE /HYSTEROSCOPY/myomyectomy;  Surgeon: Emily Filbert, MD;  Location: [facility];  Service: Gynecology;  Laterality: N/A;   Family History  Problem Relation Age of Onset  . CAD Mother   . Kidney failure Mother   . Diabetes type II Mother   . Hypertension Mother   . CAD Father   . Diabetes type II Father   . Hypertension Father   . Diabetes type II Brother    Social History  Substance Use Topics  . Smoking status: Former Smoker -- 0.25 packs/day for 20 years    Types: Cigarettes  . Smokeless tobacco: None  . Alcohol Use: No   OB History    Gravida Para Term Preterm AB TAB SAB Ectopic Multiple Living   0              Review of Systems  Respiratory: Negative for cough.   Gastrointestinal: Positive for vomiting, abdominal pain and anorexia.  All other systems reviewed and are  negative.     Allergies  Latex; Morphine and related; and Penicillins  Home Medications   Prior to Admission medications   Medication Sig Start Date End Date Taking? Authorizing Provider  acetaminophen (TYLENOL) 500 MG tablet Take 1,000 mg by mouth every 6 (six) hours as needed for moderate pain (pain).   Yes Historical Provider, MD  amLODipine (NORVASC) 10 MG tablet Take 10 mg by mouth daily.   Yes Historical Provider, MD  atenolol (TENORMIN) 50 MG tablet Take 50 mg by mouth daily.   Yes Historical Provider, MD  diphenhydrAMINE (BENADRYL) 25 MG tablet Take 50 mg by mouth daily as needed for allergies.   Yes Historical Provider, MD  glipiZIDE (GLUCOTROL) 10 MG tablet Take 10 mg by mouth daily before breakfast.    Yes Historical Provider, MD  hydrochlorothiazide (HYDRODIURIL) 25 MG tablet Take 25 mg by mouth daily. Take half a tablet (12.5mg ) by mouth daily   Yes Historical Provider, MD  Insulin Glargine (LANTUS SOLOSTAR) 100 UNIT/ML Solostar Pen Inject 10 Units into the skin daily at 10 pm. Patient taking differently: Inject 20 Units into the skin daily at 10 pm.  10/21/14  Yes Hosie Poisson, MD  insulin lispro (HUMALOG) 100 UNIT/ML injection Inject 0-15 Units into the skin 3 (three) times daily before meals.   Yes Historical Provider, MD  metFORMIN (GLUCOPHAGE) 1000 MG tablet Take 1,000 mg by mouth 2 (  two) times daily.   Yes Historical Provider, MD  Omega-3 Fatty Acids (OMEGA-3 FISH OIL PO) Take 1 capsule by mouth 2 (two) times daily with breakfast and lunch.   Yes Historical Provider, MD  pantoprazole (PROTONIX) 40 MG tablet Take 1 tablet by mouth twice daily 30 minutes before breakfast and dinner 07/06/15  Yes Historical Provider, MD  sertraline (ZOLOFT) 50 MG tablet Take 50 mg by mouth daily. 01/25/15  Yes Historical Provider, MD  gabapentin (NEURONTIN) 800 MG tablet Take 1 tablet (800 mg total) by mouth 3 (three) times daily. 07/28/15   Debby Freiberg, MD  hydrOXYzine (ATARAX/VISTARIL) 25 MG  tablet Take 1 tablet (25 mg total) by mouth at bedtime as needed for anxiety or itching. Take half a tablet daily 07/28/15   Debby Freiberg, MD  insulin aspart (NOVOLOG) 100 UNIT/ML injection Inject 0-15 Units into the skin 3 (three) times daily with meals. Patient not taking: Reported on 11/04/2014 10/21/14   Hosie Poisson, MD  levofloxacin (LEVAQUIN) 500 MG tablet Take 1 tablet (500 mg total) by mouth daily. Patient not taking: Reported on 11/04/2014 10/21/14   Hosie Poisson, MD  ondansetron (ZOFRAN ODT) 4 MG disintegrating tablet Take 1 tablet (4 mg total) by mouth every 8 (eight) hours as needed for nausea. 07/28/15   Debby Freiberg, MD  potassium chloride (K-DUR) 10 MEQ tablet Take 1 tablet (10 mEq total) by mouth 2 (two) times daily. Patient not taking: Reported on 07/27/2015 03/15/15   Tanna Furry, MD  promethazine (PHENERGAN) 25 MG tablet Take 1 tablet (25 mg total) by mouth every 6 (six) hours as needed for nausea or vomiting. Patient not taking: Reported on 03/15/2015 05/28/14   Ernestina Patches, MD  ramipril (ALTACE) 2.5 MG capsule Take 1 capsule (2.5 mg total) by mouth daily. Patient not taking: Reported on 03/15/2015 10/21/14   Hosie Poisson, MD   BP 149/80 mmHg  Pulse 94  Temp(Src) 98.7 F (37.1 C) (Oral)  Resp 20  Ht 5\' 2"  (1.575 m)  Wt 165 lb (74.844 kg)  BMI 30.17 kg/m2  SpO2 100% Physical Exam  Constitutional: She is oriented to person, place, and time. She appears well-developed and well-nourished.  HENT:  Head: Normocephalic and atraumatic.  Right Ear: External ear normal.  Left Ear: External ear normal.  Eyes: Conjunctivae and EOM are normal. Pupils are equal, round, and reactive to light.  Neck: Normal range of motion. Neck supple.  Cardiovascular: Normal rate, regular rhythm, normal heart sounds and intact distal pulses.   Pulmonary/Chest: Effort normal and breath sounds normal.  Abdominal: Soft. Bowel sounds are normal. There is tenderness in the left upper quadrant.   Musculoskeletal: Normal range of motion.  Neurological: She is alert and oriented to person, place, and time.  Skin: Skin is warm and dry.  Vitals reviewed.   ED Course  Procedures (including critical care time) Labs Review Labs Reviewed  URINALYSIS, ROUTINE W REFLEX MICROSCOPIC (NOT AT [facility]) - Abnormal; Notable for the following:    APPearance CLOUDY (*)    Hgb urine dipstick SMALL (*)    Bilirubin Urine SMALL (*)    Ketones, ur 40 (*)    Protein, ur >300 (*)    Leukocytes, UA SMALL (*)    All other components within normal limits  CBC WITH DIFFERENTIAL/PLATELET - Abnormal; Notable for the following:    Platelets 446 (*)    All other components within normal limits  BASIC METABOLIC PANEL - Abnormal; Notable for the following:    Chloride  100 (*)    CO2 20 (*)    Glucose, Bld 214 (*)    All other components within normal limits  HEPATIC FUNCTION PANEL - Abnormal; Notable for the following:    Total Protein 8.5 (*)    Bilirubin, Direct <0.1 (*)    All other components within normal limits  URINE MICROSCOPIC-ADD ON - Abnormal; Notable for the following:    Squamous Epithelial / LPF MANY (*)    Bacteria, UA FEW (*)    Casts HYALINE CASTS (*)    All other components within normal limits  LIPASE, BLOOD  POC URINE PREG, ED  I-STAT TROPOININ, ED    Imaging Review Ct Abdomen Pelvis W Contrast  07/28/2015   CLINICAL DATA:  Left upper quadrant abdominal pain.  EXAM: CT ABDOMEN AND PELVIS WITH CONTRAST  TECHNIQUE: Multidetector CT imaging of the abdomen and pelvis was performed using the standard protocol following bolus administration of intravenous contrast.  CONTRAST:  2mL OMNIPAQUE IOHEXOL 300 MG/ML SOLN, 130mL OMNIPAQUE IOHEXOL 300 MG/ML SOLN  COMPARISON:  None.  FINDINGS: Lower chest:  No significant abnormality.  Hepatobiliary: There are normal appearances of the liver, gallbladder and bile ducts.  Pancreas: Normal  Spleen: Normal  Adrenals/Urinary Tract: The adrenals and  kidneys are normal in appearance. There is no urinary calculus evident. There is no hydronephrosis or ureteral dilatation. Collecting systems and ureters appear unremarkable.  Stomach/Bowel: There are normal appearances of the stomach, small bowel and colon. The appendix is normal.  Vascular/Lymphatic: The abdominal aorta is normal in caliber. There is mild atherosclerotic calcification. There is no adenopathy in the abdomen or pelvis.  Reproductive: The uterus and ovaries appear unremarkable.  Other: There is no ascites. No focal inflammatory changes are evident in the abdomen or pelvis.  Musculoskeletal: No significant musculoskeletal lesion. There is moderately severe facet arthritis at L4-5 and L5-S1. There is grade 1 degenerative spondylolisthesis at L4-5.  IMPRESSION: No acute significant abnormalities are evident in the abdomen or pelvis.  Lower lumbar facet arthritis.  Mild L4-5 spondylolisthesis.   Electronically Signed   By: Andreas Newport M.D.   On: 07/28/2015 00:24   I have personally reviewed and evaluated these images and lab results as part of my medical decision-making.   EKG Interpretation   Date/Time:  Wednesday July 27 2015 19:19:17 EDT Ventricular Rate:  101 PR Interval:  138 QRS Duration: 85 QT Interval:  329 QTC Calculation: 426 R Axis:   35 Text Interpretation:  Age not entered, assumed to be  54 years old for  purpose of ECG interpretation Sinus tachycardia Borderline repolarization  abnormality since last tracing no significant change Confirmed by Eulis Foster   MD, ELLIOTT [phone]) on 07/27/2015 7:23:26 PM      MDM   Final diagnoses:  Generalized abdominal pain    54 y.o. female with pertinent PMH of DM, HTN presents with abd pain, n/v, constipation as above x 3 days.  States symptoms are identical to prior DKA.  On arrival vitals and physical exam as above.  No encoparesis or other historical elements of impaction, and pt states she normally only has BM q 2 days.  CT  scan demonstrated no acute abnormalities.  Pt well appearing, takes PO without difficulty.  DC home in stable condition.  Pt refused reglan prescription.    I have reviewed all laboratory and imaging studies if ordered as above  1. Generalized abdominal pain         Debby Freiberg, MD 07/29/15 [phone]

## 2015-07-27 NOTE — ED Notes (Signed)
Bed: QM57 Expected date:  Expected time:  Means of arrival:  Comments: EMS- 54yo F, LUQ pain, n/v

## 2015-07-27 NOTE — ED Notes (Signed)
Patient started itching once given Dilaudid

## 2015-07-27 NOTE — ED Notes (Signed)
EKG given to Dr. Wentz 

## 2015-07-27 NOTE — ED Notes (Signed)
Patient transported to CT 

## 2015-07-27 NOTE — ED Notes (Signed)
Pt comes in today with EMS from home with a c/ LUQ abdominal pain. Pt states that she has not had a BM in 4 days. Pt has had decreased appetite. Pt has been voiding normally. Pt states the last time she had pain like this she was diagnosed with DKA. Pt states that she has been able to take her medications today with no difficulty. Pt has also taken her insulin today as well. Pt has not had anything to eat.

## 2015-07-28 MED ORDER — IOHEXOL 300 MG/ML  SOLN
100.0000 mL | Freq: Once | INTRAMUSCULAR | Status: AC | PRN
  Administered 2015-07-28: 100 mL via INTRAVENOUS

## 2015-07-28 MED ORDER — ONDANSETRON 4 MG PO TBDP: 4.0000 mg | ORAL_TABLET | Freq: Three times a day (TID) | ORAL | Status: AC | PRN

## 2015-07-28 MED ORDER — SODIUM CHLORIDE 0.9 % IV BOLUS (SEPSIS)
1000.0000 mL | Freq: Once | INTRAVENOUS | Status: AC
  Administered 2015-07-28: 1000 mL via INTRAVENOUS

## 2015-07-28 MED ORDER — IOHEXOL 350 MG/ML SOLN: 100.0000 mL | Freq: Once | INTRAVENOUS | Status: DC | PRN

## 2015-07-28 MED ORDER — GABAPENTIN 800 MG PO TABS: 800.0000 mg | ORAL_TABLET | Freq: Three times a day (TID) | ORAL | Status: AC

## 2015-07-28 MED ORDER — HYDROXYZINE HCL 25 MG PO TABS: 25.0000 mg | ORAL_TABLET | Freq: Every evening | ORAL | Status: DC | PRN

## 2015-07-28 NOTE — ED Notes (Signed)
MD at bedside. 

## 2015-07-28 NOTE — Discharge Instructions (Signed)

## 2015-07-28 NOTE — ED Notes (Signed)
Verbal order given by beth to discontinue iv.

## 2016-05-06 ENCOUNTER — Encounter (HOSPITAL_COMMUNITY): Payer: Self-pay | Admitting: *Deleted

## 2016-05-06 ENCOUNTER — Emergency Department (HOSPITAL_COMMUNITY)
Admission: EM | Admit: 2016-05-06 | Discharge: 2016-05-06 | Disposition: A | Discharge status: Home / Self Care | Payer: Medicaid Other | Attending: Emergency Medicine | Admitting: Emergency Medicine

## 2016-05-06 DIAGNOSIS — I10 Essential (primary) hypertension: Secondary | ICD-10-CM | POA: Diagnosis not present

## 2016-05-06 DIAGNOSIS — E11649 Type 2 diabetes mellitus with hypoglycemia without coma: Secondary | ICD-10-CM | POA: Insufficient documentation

## 2016-05-06 DIAGNOSIS — Z794 Long term (current) use of insulin: Secondary | ICD-10-CM | POA: Insufficient documentation

## 2016-05-06 DIAGNOSIS — F1210 Cannabis abuse, uncomplicated: Secondary | ICD-10-CM | POA: Diagnosis not present

## 2016-05-06 DIAGNOSIS — Z79899 Other long term (current) drug therapy: Secondary | ICD-10-CM | POA: Diagnosis not present

## 2016-05-06 DIAGNOSIS — Z87891 Personal history of nicotine dependence: Secondary | ICD-10-CM | POA: Diagnosis not present

## 2016-05-06 DIAGNOSIS — E162 Hypoglycemia, unspecified: Secondary | ICD-10-CM

## 2016-05-06 LAB — CBG MONITORING, ED
GLUCOSE-CAPILLARY: 216 mg/dL — AB (ref 65–99)
Glucose-Capillary: 137 mg/dL — ABNORMAL HIGH (ref 65–99)
Glucose-Capillary: 117 mg/dL — ABNORMAL HIGH (ref 65–99)

## 2016-05-06 MED ORDER — ACETAMINOPHEN 500 MG PO TABS
1000.0000 mg | ORAL_TABLET | Freq: Once | ORAL | Status: AC
  Administered 2016-05-06: 1000 mg via ORAL
  Filled 2016-05-06: qty 2

## 2016-05-06 NOTE — ED Provider Notes (Signed)
CSN: [id number]     Arrival date & time 05/06/16  1031 History   First MD Initiated Contact with Patient 05/06/16 1038     Chief Complaint  Patient presents with  . Hypoglycemia    HPI  Marie Fox is an 55 y.o. female with history of IDDM and HTN who presents to the ED for evaluation of hypoglycemia. She states she was fasting today (last PO intake last night after dinner). She states she checked her CBG when she woke up this morning and it was 300 so she took her usual dose of 10U HumaLog and went for a walk. She states she felt well until she got back after her walk, laid down and started feeling weak, clammy, shakey. She states she called EMS and when they checked her CBG it was 60 and gave her some juice to drink. On arrival to the ED pt's CBG now 137. She states she feels well. She has no complaints other than a headache that has been present since earlier this morning. She describes a dull, gradual onset posterior headache which she states is normal for her and she gets from time to time. Denies weakness, numbness, tingling, fever, chills, n/v.   Past Medical History  Diagnosis Date  . Hypertension   . Diabetes mellitus without complication [facility])    Past Surgical History  Procedure Laterality Date  . Hysteroscopy w/d&c N/A 06/11/2013    Procedure: DILATATION AND CURETTAGE /HYSTEROSCOPY/myomyectomy;  Surgeon: Emily Filbert, MD;  Location: [facility];  Service: Gynecology;  Laterality: N/A;   Family History  Problem Relation Age of Onset  . CAD Mother   . Kidney failure Mother   . Diabetes type II Mother   . Hypertension Mother   . CAD Father   . Diabetes type II Father   . Hypertension Father   . Diabetes type II Brother    Social History  Substance Use Topics  . Smoking status: Former Smoker -- 0.25 packs/day for 20 years    Types: Cigarettes  . Smokeless tobacco: None  . Alcohol Use: No   OB History    Gravida Para Term Preterm AB TAB SAB Ectopic Multiple Living   0               Review of Systems  All other systems reviewed and are negative.     Allergies  Latex; Morphine and related; Penicillin g; and Statins  Home Medications   Prior to Admission medications   Medication Sig Start Date End Date Taking? Authorizing Provider  acetaminophen (TYLENOL) 500 MG tablet Take 1,000 mg by mouth every 6 (six) hours as needed for moderate pain (pain).   Yes Historical Provider, MD  amLODipine (NORVASC) 10 MG tablet Take 10 mg by mouth daily.   Yes Historical Provider, MD  atenolol (TENORMIN) 50 MG tablet Take 50 mg by mouth daily.   Yes Historical Provider, MD  diphenhydrAMINE (BENADRYL) 25 MG tablet Take 50 mg by mouth daily as needed for allergies.   Yes Historical Provider, MD  gabapentin (NEURONTIN) 800 MG tablet Take 1 tablet (800 mg total) by mouth 3 (three) times daily. 07/28/15  Yes Debby Freiberg, MD  glipiZIDE (GLUCOTROL) 10 MG tablet Take 10 mg by mouth daily before breakfast.    Yes Historical Provider, MD  hydrochlorothiazide (HYDRODIURIL) 25 MG tablet Take 12.5 mg by mouth daily. Take half a tablet (12.5mg ) by mouth daily   Yes Historical Provider, MD  hydrOXYzine (ATARAX/VISTARIL) 25 MG tablet Take  1 tablet (25 mg total) by mouth at bedtime as needed for anxiety or itching. Take half a tablet daily Patient taking differently: Take 25 mg by mouth at bedtime as needed for anxiety or itching.  07/28/15  Yes Debby Freiberg, MD  hydrOXYzine (VISTARIL) 50 MG capsule Take 50 mg by mouth 3 (three) times daily as needed. Anxiety/ itching 02/28/16  Yes Historical Provider, MD  Insulin Glargine (LANTUS SOLOSTAR) 100 UNIT/ML Solostar Pen Inject 10 Units into the skin daily at 10 pm. Patient taking differently: Inject 20 Units into the skin daily at 10 pm.  10/21/14  Yes Hosie Poisson, MD  insulin lispro (HUMALOG) 100 UNIT/ML injection Inject 0-15 Units into the skin 3 (three) times daily before meals. Per sliding scale   Yes Historical Provider, MD  INVOKANA 300 MG TABS  tablet Take 300 mg by mouth daily. 04/27/16  Yes Historical Provider, MD  LORazepam (ATIVAN) 1 MG tablet Take 1 mg by mouth every 6 (six) hours as needed. anxiety 04/27/16  Yes Historical Provider, MD  metFORMIN (GLUCOPHAGE) 1000 MG tablet Take 1,000 mg by mouth 2 (two) times daily.   Yes Historical Provider, MD  Omega-3 Fatty Acids (OMEGA-3 FISH OIL PO) Take 1 capsule by mouth 2 (two) times daily with breakfast and lunch.   Yes Historical Provider, MD  ondansetron (ZOFRAN ODT) 4 MG disintegrating tablet Take 1 tablet (4 mg total) by mouth every 8 (eight) hours as needed for nausea. 07/28/15  Yes Debby Freiberg, MD  promethazine (PHENERGAN) 25 MG tablet Take 1 tablet (25 mg total) by mouth every 6 (six) hours as needed for nausea or vomiting. 05/28/14  Yes Ernestina Patches, MD  sertraline (ZOLOFT) 50 MG tablet Take 50 mg by mouth daily. 01/25/15  Yes Historical Provider, MD  temazepam (RESTORIL) 15 MG capsule Take 15 mg by mouth at bedtime as needed. sleep 12/14/15  Yes Historical Provider, MD  insulin aspart (NOVOLOG) 100 UNIT/ML injection Inject 0-15 Units into the skin 3 (three) times daily with meals. Patient not taking: Reported on 05/06/2016 10/21/14   Hosie Poisson, MD  potassium chloride (K-DUR) 10 MEQ tablet Take 1 tablet (10 mEq total) by mouth 2 (two) times daily. Patient not taking: Reported on 07/27/2015 03/15/15   Tanna Furry, MD  ramipril (ALTACE) 2.5 MG capsule Take 1 capsule (2.5 mg total) by mouth daily. Patient not taking: Reported on 03/15/2015 10/21/14   Hosie Poisson, MD   BP 137/88 mmHg  Pulse 81  Temp(Src) 98.3 F (36.8 C) (Oral)  Resp 18  Ht 5\' 2"  (1.575 m)  Wt 77.111 kg  BMI 31.09 kg/m2  SpO2 97% Physical Exam  Constitutional: She is oriented to person, place, and time.  HENT:  Right Ear: External ear normal.  Left Ear: External ear normal.  Nose: Nose normal.  Mouth/Throat: Oropharynx is clear and moist. No oropharyngeal exudate.  Eyes: Conjunctivae and EOM are normal. Pupils  are equal, round, and reactive to light.  Neck: Normal range of motion. Neck supple.  Cardiovascular: Normal rate, regular rhythm, normal heart sounds and intact distal pulses.   Pulmonary/Chest: Effort normal and breath sounds normal. No respiratory distress. She has no wheezes. She exhibits no tenderness.  Abdominal: Soft. Bowel sounds are normal. She exhibits no distension. There is no tenderness. There is no rebound and no guarding.  Musculoskeletal: She exhibits no edema.  Neurological: She is alert and oriented to person, place, and time. No cranial nerve deficit.  Skin: Skin is warm and dry.  Psychiatric:  She has a normal mood and affect.  Nursing note and vitals reviewed.   ED Course  Procedures (including critical care time) Labs Review Labs Reviewed  CBG MONITORING, ED - Abnormal; Notable for the following:    Glucose-Capillary 137 (*)    All other components within normal limits  CBG MONITORING, ED - Abnormal; Notable for the following:    Glucose-Capillary 117 (*)    All other components within normal limits  CBG MONITORING, ED - Abnormal; Notable for the following:    Glucose-Capillary 216 (*)    All other components within normal limits    Imaging Review No results found. I have personally reviewed and evaluated these images and lab results as part of my medical decision-making.   EKG Interpretation None      MDM   Final diagnoses:  Hypoglycemia    No further episodes of hypoglycemia in the ED. Pt tolerating PO snacks. She remains asymptomatic. Headache resolved. I encouraged small meals throughout the day and to talk to PCP if she plans to do fasts in the future. Encouraged close PCP f/u this week. ER return precautions given.    Anne Ng, PA-C 05/06/16 1336  Virgel Manifold, MD 05/18/16 2125

## 2016-05-06 NOTE — Discharge Instructions (Signed)
Please follow up with your primary care provider this week. Remember to eat small meals throughout the day. Return to the ER for new or worsening symptoms.   Hypoglycemia Hypoglycemia occurs when the glucose in your blood is too low. Glucose is a type of sugar that is your body's main energy source. Hormones, such as insulin and glucagon, control the level of glucose in the blood. Insulin lowers blood glucose and glucagon increases blood glucose. Having too much insulin in your blood stream, or not eating enough food containing sugar, can result in hypoglycemia. Hypoglycemia can happen to people with or without diabetes. It can develop quickly and can be a medical emergency.  CAUSES   Missing or delaying meals.  Not eating enough carbohydrates at meals.  Taking too much diabetes medicine.  Not timing your oral diabetes medicine or insulin doses with meals, snacks, and exercise.  Nausea and vomiting.  Certain medicines.  Severe illnesses, such as hepatitis, kidney disorders, and certain eating disorders.  Increased activity or exercise without eating something extra or adjusting medicines.  Drinking too much alcohol.  A nerve disorder that affects body functions like your heart rate, blood pressure, and digestion (autonomic neuropathy).  A condition where the stomach muscles do not function properly (gastroparesis). Therefore, medicines and food may not absorb properly.  Rarely, a tumor of the pancreas can produce too much insulin. SYMPTOMS   Hunger.  Sweating (diaphoresis).  Change in body temperature.  Shakiness.  Headache.  Anxiety.  Lightheadedness.  Irritability.  Difficulty concentrating.  Dry mouth.  Tingling or numbness in the hands or feet.  Restless sleep or sleep disturbances.  Altered speech and coordination.  Change in mental status.  Seizures or prolonged convulsions.  Combativeness.  Drowsiness (lethargic).  Weakness.  Increased heart  rate or palpitations.  Confusion.  Pale, gray skin color.  Blurred or double vision.  Fainting. DIAGNOSIS  A physical exam and medical history will be performed. Your caregiver may make a diagnosis based on your symptoms. Blood tests and other lab tests may be performed to confirm a diagnosis. Once the diagnosis is made, your caregiver will see if your signs and symptoms go away once your blood glucose is raised.  TREATMENT  Usually, you can easily treat your hypoglycemia when you notice symptoms.  Check your blood glucose. If it is less than 70 mg/dl, take one of the following:   3-4 glucose tablets.    cup juice.    cup regular soda.   1 cup skim milk.   -1 tube of glucose gel.   5-6 hard candies.   Avoid high-fat drinks or food that may delay a rise in blood glucose levels.  Do not take more than the recommended amount of sugary foods, drinks, gel, or tablets. Doing so will cause your blood glucose to go too high.   Wait 10-15 minutes and recheck your blood glucose. If it is still less than 70 mg/dl or below your target range, repeat treatment.   Eat a snack if it is more than 1 hour until your next meal.  There may be a time when your blood glucose may go so low that you are unable to treat yourself at home when you start to notice symptoms. You may need someone to help you. You may even faint or be unable to swallow. If you cannot treat yourself, someone will need to bring you to the hospital.  [place]  If you have diabetes, follow your diabetes management  plan by:  Taking your medicines as directed.  Following your exercise plan.  Following your meal plan. Do not skip meals. Eat on time.  Testing your blood glucose regularly. Check your blood glucose before and after exercise. If you exercise longer or different than usual, be sure to check blood glucose more frequently.  Wearing your medical alert jewelry that says you have  diabetes.  Identify the cause of your hypoglycemia. Then, develop ways to prevent the recurrence of hypoglycemia.  Do not take a hot bath or shower right after an insulin shot.  Always carry treatment with you. Glucose tablets are the easiest to carry.  If you are going to drink alcohol, drink it only with meals.  Tell friends or family members ways to keep you safe during a seizure. This may include removing hard or sharp objects from the area or turning you on your side.  Maintain a healthy weight. SEEK MEDICAL CARE IF:   You are having problems keeping your blood glucose in your target range.  You are having frequent episodes of hypoglycemia.  You feel you might be having side effects from your medicines.  You are not sure why your blood glucose is dropping so low.  You notice a change in vision or a new problem with your vision. SEEK IMMEDIATE MEDICAL CARE IF:   Confusion develops.  A change in mental status occurs.  The inability to swallow develops.  Fainting occurs.   This information is not intended to replace advice given to you by your health care provider. Make sure you discuss any questions you have with your health care provider.   Document Released: 11/12/2005 Document Revised: 11/17/2013 Document Reviewed: 07/19/2015 Elsevier Interactive Patient Education [employer].

## 2016-05-06 NOTE — ED Notes (Signed)
Pa  at bedside. 

## 2016-05-06 NOTE — ED Notes (Signed)
Pt bought in by EMS from home for BS 62. After oj 162

## 2016-05-06 NOTE — ED Notes (Signed)
Bed: WA16 Expected date:  Expected time:  Means of arrival:  Comments: EMS-hypoglycemia 

## 2018-06-05 ENCOUNTER — Encounter (HOSPITAL_BASED_OUTPATIENT_CLINIC_OR_DEPARTMENT_OTHER): Payer: Self-pay | Admitting: *Deleted

## 2018-06-05 NOTE — H&P (Signed)
HISTORY AND PHYSICAL  Marie Fox is a 57 y.o. female patient with CC: painful teeth   No diagnosis found.  Past Medical History:  Diagnosis Date  . Anemia   . Anxiety   . Depression   . Diabetes mellitus without complication ([place])   . GERD (gastroesophageal reflux disease)   . Hypertension     No current facility-administered medications for this encounter.    Current Outpatient Medications  Medication Sig Dispense Refill  . acetaminophen (TYLENOL) 500 MG tablet Take 2,000 mg by mouth every 6 (six) hours as needed for moderate pain or headache.     Marland Kitchen amLODipine (NORVASC) 10 MG tablet Take 10 mg by mouth daily.    Marland Kitchen atenolol (TENORMIN) 50 MG tablet Take 50 mg by mouth daily.    . dapagliflozin propanediol (FARXIGA) 10 MG TABS tablet Take 10 mg by mouth daily.    . Exenatide ER (BYDUREON) 2 MG PEN Inject 2 mg into the skin every Wednesday.    . ezetimibe (ZETIA) 10 MG tablet Take 10 mg by mouth daily.    Marland Kitchen gabapentin (NEURONTIN) 800 MG tablet Take 1 tablet (800 mg total) by mouth 3 (three) times daily. 30 tablet 0  . glipiZIDE (GLUCOTROL) 10 MG tablet Take 10 mg by mouth 2 (two) times daily.     . hydrochlorothiazide (HYDRODIURIL) 25 MG tablet Take 12.5 mg by mouth daily.     . hydrOXYzine (VISTARIL) 50 MG capsule Take 50 mg by mouth 3 (three) times daily as needed for anxiety or itching.   11  . insulin aspart (NOVOLOG) 100 UNIT/ML injection Inject 12-20 Units into the skin 3 (three) times daily as needed for high blood sugar.    . Insulin Glargine (LANTUS SOLOSTAR) 100 UNIT/ML Solostar Pen Inject 10 Units into the skin daily at 10 pm. (Patient taking differently: Inject 62 Units into the skin daily at 10 pm. ) 15 mL 2  . LORazepam (ATIVAN) 1 MG tablet Take 1 mg by mouth every 6 (six) hours as needed for anxiety.   5  . metFORMIN (GLUCOPHAGE) 1000 MG tablet Take 1,000 mg by mouth 2 (two) times daily.    . Omega-3 Fatty Acids (OMEGA-3 FISH OIL PO) Take 1 capsule by mouth 2 (two)  times daily.     Marland Kitchen omeprazole (PRILOSEC) 40 MG capsule Take 40 mg by mouth daily before supper.  5  . ondansetron (ZOFRAN ODT) 4 MG disintegrating tablet Take 1 tablet (4 mg total) by mouth every 8 (eight) hours as needed for nausea. 20 tablet 0  . sertraline (ZOLOFT) 50 MG tablet Take 50 mg by mouth daily.  2   Allergies  Allergen Reactions  . Latex Itching  . Morphine And Related Itching  . Penicillin G Hives, Itching and Other (See Comments)    Has patient had a PCN reaction causing immediate rash, facial/tongue/throat swelling, SOB or lightheadedness with hypotension: yes Has patient had a PCN reaction causing severe rash involving mucus membranes or skin necrosis: no Has patient had a PCN reaction that required hospitalization: unknown Has patient had a PCN reaction occurring within the last 10 years: no If all of the above answers are "NO", then may proceed with Cephalosporin use.   . Statins Other (See Comments)    Unknown   Active Problems:   * No active hospital problems. *  Vitals: Height 5\' 2"  (1.575 m), weight 86.6 kg (191 lb), last menstrual period 06/10/2013. Lab results:No results found for this or  any previous visit (from the past 24 hour(s)). Radiology Results: No results found. General appearance: alert, cooperative and moderately obese Head: Normocephalic, without obvious abnormality, atraumatic Eyes: conjunctivae/corneas clear. PERRL, EOM's intact. Fundi benign. Nose: Nares normal. Septum midline. Mucosa normal. No drainage or sinus tenderness. Throat: dental caries, bone loss teeth # 2, 3, 4, 6, 7, 8, 9, 11, 20, 32. Pharynx clear, no trismus. Neck: no adenopathy, supple, symmetrical, trachea midline and thyroid not enlarged, symmetric, no tenderness/mass/nodules Resp: clear to auscultation bilaterally Cardio: regular rate and rhythm, S1, S2 normal, no murmur, click, rub or gallop  Assessment: Multiple nonrestorable teeth secondary to dental caries and  periodontitis.  Plan: Multiple dental extractions with alveoloplasty. GA. Day surgery.   Diona Browner 06/05/2018

## 2018-06-06 ENCOUNTER — Ambulatory Visit (HOSPITAL_BASED_OUTPATIENT_CLINIC_OR_DEPARTMENT_OTHER): Payer: Medicaid Other | Admitting: Certified Registered Nurse Anesthetist

## 2018-06-06 ENCOUNTER — Other Ambulatory Visit: Payer: Self-pay

## 2018-06-06 ENCOUNTER — Encounter (HOSPITAL_BASED_OUTPATIENT_CLINIC_OR_DEPARTMENT_OTHER): Payer: Self-pay | Admitting: Emergency Medicine

## 2018-06-06 ENCOUNTER — Encounter (HOSPITAL_BASED_OUTPATIENT_CLINIC_OR_DEPARTMENT_OTHER)
Admission: RE | Disposition: A | Discharge status: Home / Self Care | Payer: Self-pay | Source: Ambulatory Visit | Attending: Oral Surgery

## 2018-06-06 ENCOUNTER — Ambulatory Visit (HOSPITAL_BASED_OUTPATIENT_CLINIC_OR_DEPARTMENT_OTHER)
Admission: RE | Admit: 2018-06-06 | Discharge: 2018-06-06 | Disposition: A | Discharge status: Home / Self Care | Payer: Medicaid Other | Source: Ambulatory Visit | Attending: Oral Surgery | Admitting: Oral Surgery

## 2018-06-06 DIAGNOSIS — K029 Dental caries, unspecified: Secondary | ICD-10-CM | POA: Insufficient documentation

## 2018-06-06 DIAGNOSIS — I10 Essential (primary) hypertension: Secondary | ICD-10-CM | POA: Diagnosis not present

## 2018-06-06 DIAGNOSIS — Z794 Long term (current) use of insulin: Secondary | ICD-10-CM | POA: Diagnosis not present

## 2018-06-06 DIAGNOSIS — K219 Gastro-esophageal reflux disease without esophagitis: Secondary | ICD-10-CM | POA: Diagnosis not present

## 2018-06-06 DIAGNOSIS — E119 Type 2 diabetes mellitus without complications: Secondary | ICD-10-CM | POA: Insufficient documentation

## 2018-06-06 DIAGNOSIS — E669 Obesity, unspecified: Secondary | ICD-10-CM | POA: Insufficient documentation

## 2018-06-06 DIAGNOSIS — Z87891 Personal history of nicotine dependence: Secondary | ICD-10-CM | POA: Diagnosis not present

## 2018-06-06 DIAGNOSIS — F419 Anxiety disorder, unspecified: Secondary | ICD-10-CM | POA: Diagnosis not present

## 2018-06-06 DIAGNOSIS — K0530 Chronic periodontitis, unspecified: Secondary | ICD-10-CM | POA: Diagnosis not present

## 2018-06-06 DIAGNOSIS — Z6835 Body mass index (BMI) 35.0-35.9, adult: Secondary | ICD-10-CM | POA: Diagnosis not present

## 2018-06-06 DIAGNOSIS — Z79899 Other long term (current) drug therapy: Secondary | ICD-10-CM | POA: Insufficient documentation

## 2018-06-06 HISTORY — DX: Anemia, unspecified: D64.9

## 2018-06-06 HISTORY — PX: MULTIPLE EXTRACTIONS WITH ALVEOLOPLASTY: SHX5342

## 2018-06-06 HISTORY — DX: Gastro-esophageal reflux disease without esophagitis: K21.9

## 2018-06-06 HISTORY — DX: Depression, unspecified: F32.A

## 2018-06-06 HISTORY — DX: Major depressive disorder, single episode, unspecified: F32.9

## 2018-06-06 HISTORY — DX: Anxiety disorder, unspecified: F41.9

## 2018-06-06 LAB — POCT I-STAT, CHEM 8
BUN: 17 mg/dL (ref 6–20)
CALCIUM ION: 1.16 mmol/L (ref 1.15–1.40)
CREATININE: 0.8 mg/dL (ref 0.44–1.00)
Chloride: 105 mmol/L (ref 98–111)
GLUCOSE: 170 mg/dL — AB (ref 70–99)
HCT: 39 % (ref 36.0–46.0)
Hemoglobin: 13.3 g/dL (ref 12.0–15.0)
Potassium: 4.3 mmol/L (ref 3.5–5.1)
SODIUM: 141 mmol/L (ref 135–145)
TCO2: 26 mmol/L (ref 22–32)

## 2018-06-06 LAB — GLUCOSE, CAPILLARY: Glucose-Capillary: 176 mg/dL — ABNORMAL HIGH (ref 70–99)

## 2018-06-06 SURGERY — MULTIPLE EXTRACTION WITH ALVEOLOPLASTY
Anesthesia: General | Site: Mouth | Laterality: Bilateral

## 2018-06-06 MED ORDER — BACITRACIN-NEOMYCIN-POLYMYXIN 400-5-5000 EX OINT
TOPICAL_OINTMENT | CUTANEOUS | Status: AC
  Filled 2018-06-06: qty 1

## 2018-06-06 MED ORDER — LACTATED RINGERS IV SOLN
INTRAVENOUS | Status: DC
  Administered 2018-06-06 (×2): via INTRAVENOUS

## 2018-06-06 MED ORDER — LIDOCAINE-EPINEPHRINE 2 %-1:100000 IJ SOLN
INTRAMUSCULAR | Status: AC
  Filled 2018-06-06: qty 2

## 2018-06-06 MED ORDER — FENTANYL CITRATE (PF) 100 MCG/2ML IJ SOLN: 25.0000 ug | INTRAMUSCULAR | Status: DC | PRN

## 2018-06-06 MED ORDER — CLINDAMYCIN HCL 300 MG PO CAPS: 300.0000 mg | ORAL_CAPSULE | Freq: Three times a day (TID) | ORAL | 0 refills | Status: AC

## 2018-06-06 MED ORDER — SUGAMMADEX SODIUM 200 MG/2ML IV SOLN
INTRAVENOUS | Status: AC
  Filled 2018-06-06: qty 2

## 2018-06-06 MED ORDER — SUGAMMADEX SODIUM 200 MG/2ML IV SOLN
INTRAVENOUS | Status: DC | PRN
  Administered 2018-06-06: 360 mg via INTRAVENOUS

## 2018-06-06 MED ORDER — FENTANYL CITRATE (PF) 100 MCG/2ML IJ SOLN
INTRAMUSCULAR | Status: DC | PRN
  Administered 2018-06-06: 100 ug via INTRAVENOUS

## 2018-06-06 MED ORDER — SUGAMMADEX SODIUM 500 MG/5ML IV SOLN
INTRAVENOUS | Status: AC
Start: 2018-06-06 — End: ?
  Filled 2018-06-06: qty 5

## 2018-06-06 MED ORDER — MIDAZOLAM HCL 2 MG/2ML IJ SOLN
INTRAMUSCULAR | Status: AC
  Filled 2018-06-06: qty 2

## 2018-06-06 MED ORDER — OXYMETAZOLINE HCL 0.05 % NA SOLN
NASAL | Status: DC | PRN
  Administered 2018-06-06: 1 via NASAL

## 2018-06-06 MED ORDER — FENTANYL CITRATE (PF) 100 MCG/2ML IJ SOLN
INTRAMUSCULAR | Status: AC
  Filled 2018-06-06: qty 2

## 2018-06-06 MED ORDER — OXYCODONE HCL 5 MG PO TABS: 5.0000 mg | ORAL_TABLET | Freq: Once | ORAL | Status: DC | PRN

## 2018-06-06 MED ORDER — SODIUM CHLORIDE 0.9 % IV SOLN
INTRAVENOUS | Status: AC | PRN
  Administered 2018-06-06: 500 mL

## 2018-06-06 MED ORDER — PROPOFOL 10 MG/ML IV BOLUS
INTRAVENOUS | Status: DC | PRN
  Administered 2018-06-06: 150 mg via INTRAVENOUS
  Administered 2018-06-06: 20 mg via INTRAVENOUS

## 2018-06-06 MED ORDER — MIDAZOLAM HCL 2 MG/2ML IJ SOLN
INTRAMUSCULAR | Status: DC | PRN
  Administered 2018-06-06: 2 mg via INTRAVENOUS

## 2018-06-06 MED ORDER — ONDANSETRON HCL 4 MG/2ML IJ SOLN
INTRAMUSCULAR | Status: AC
  Filled 2018-06-06: qty 2

## 2018-06-06 MED ORDER — PROMETHAZINE HCL 25 MG/ML IJ SOLN: 6.2500 mg | INTRAMUSCULAR | Status: DC | PRN

## 2018-06-06 MED ORDER — OXYCODONE-ACETAMINOPHEN 5-325 MG PO TABS: 1.0000 | ORAL_TABLET | ORAL | 0 refills | Status: AC | PRN

## 2018-06-06 MED ORDER — DEXAMETHASONE SODIUM PHOSPHATE 10 MG/ML IJ SOLN
INTRAMUSCULAR | Status: AC
  Filled 2018-06-06: qty 1

## 2018-06-06 MED ORDER — KETOROLAC TROMETHAMINE 30 MG/ML IJ SOLN
INTRAMUSCULAR | Status: AC
  Filled 2018-06-06: qty 1

## 2018-06-06 MED ORDER — ROCURONIUM BROMIDE 10 MG/ML (PF) SYRINGE
PREFILLED_SYRINGE | INTRAVENOUS | Status: DC | PRN
  Administered 2018-06-06: 60 mg via INTRAVENOUS

## 2018-06-06 MED ORDER — ONDANSETRON HCL 4 MG/2ML IJ SOLN
INTRAMUSCULAR | Status: DC | PRN
  Administered 2018-06-06: 4 mg via INTRAVENOUS

## 2018-06-06 MED ORDER — PHENYLEPHRINE 40 MCG/ML (10ML) SYRINGE FOR IV PUSH (FOR BLOOD PRESSURE SUPPORT)
PREFILLED_SYRINGE | INTRAVENOUS | Status: AC
  Filled 2018-06-06: qty 10

## 2018-06-06 MED ORDER — KETOROLAC TROMETHAMINE 30 MG/ML IJ SOLN
INTRAMUSCULAR | Status: DC | PRN
  Administered 2018-06-06: 30 mg via INTRAVENOUS

## 2018-06-06 MED ORDER — LIDOCAINE-EPINEPHRINE 2 %-1:100000 IJ SOLN
INTRAMUSCULAR | Status: DC | PRN
  Administered 2018-06-06: 17 mL

## 2018-06-06 MED ORDER — OXYCODONE HCL 5 MG/5ML PO SOLN: 5.0000 mg | Freq: Once | ORAL | Status: DC | PRN

## 2018-06-06 MED ORDER — MIDAZOLAM HCL 2 MG/2ML IJ SOLN: 1.0000 mg | INTRAMUSCULAR | Status: DC | PRN

## 2018-06-06 MED ORDER — SCOPOLAMINE 1 MG/3DAYS TD PT72: 1.0000 | MEDICATED_PATCH | Freq: Once | TRANSDERMAL | Status: DC | PRN

## 2018-06-06 MED ORDER — LIDOCAINE HCL (CARDIAC) PF 100 MG/5ML IV SOSY
PREFILLED_SYRINGE | INTRAVENOUS | Status: AC
  Filled 2018-06-06: qty 5

## 2018-06-06 MED ORDER — PROPOFOL 10 MG/ML IV BOLUS
INTRAVENOUS | Status: AC
  Filled 2018-06-06: qty 20

## 2018-06-06 MED ORDER — DEXAMETHASONE SODIUM PHOSPHATE 10 MG/ML IJ SOLN
INTRAMUSCULAR | Status: DC | PRN
  Administered 2018-06-06: 4 mg via INTRAVENOUS

## 2018-06-06 MED ORDER — CLINDAMYCIN PHOSPHATE 900 MG/50ML IV SOLN
900.0000 mg | INTRAVENOUS | Status: AC
  Administered 2018-06-06: 900 mg via INTRAVENOUS

## 2018-06-06 MED ORDER — ROCURONIUM BROMIDE 10 MG/ML (PF) SYRINGE
PREFILLED_SYRINGE | INTRAVENOUS | Status: AC
  Filled 2018-06-06: qty 10

## 2018-06-06 MED ORDER — FENTANYL CITRATE (PF) 100 MCG/2ML IJ SOLN: 50.0000 ug | INTRAMUSCULAR | Status: DC | PRN

## 2018-06-06 MED ORDER — LIDOCAINE 2% (20 MG/ML) 5 ML SYRINGE
INTRAMUSCULAR | Status: DC | PRN
  Administered 2018-06-06: 100 mg via INTRAVENOUS

## 2018-06-06 MED ORDER — CLINDAMYCIN PHOSPHATE 900 MG/50ML IV SOLN
INTRAVENOUS | Status: AC
  Filled 2018-06-06: qty 50

## 2018-06-06 MED ORDER — PHENYLEPHRINE 40 MCG/ML (10ML) SYRINGE FOR IV PUSH (FOR BLOOD PRESSURE SUPPORT)
PREFILLED_SYRINGE | INTRAVENOUS | Status: DC | PRN
  Administered 2018-06-06: 100 ug via INTRAVENOUS

## 2018-06-06 SURGICAL SUPPLY — 46 items
BANDAGE COBAN STERILE 2 (GAUZE/BANDAGES/DRESSINGS) ×4 IMPLANT
BLADE CRESCENTIC 13.5X.38X32 (BLADE) IMPLANT
BLADE SURG 15 STRL LF DISP TIS (BLADE) ×2 IMPLANT
BLADE SURG 15 STRL SS (BLADE) ×2
BUR CROSS CUT FISSURE 1.6 (BURR) ×2 IMPLANT
BUR CROSS CUT FISSURE 1.6MM (BURR) ×1
BUR EGG ELITE 4.0 (BURR) ×2 IMPLANT
BUR EGG ELITE 4.0MM (BURR) ×1
CANISTER SUCT 1200ML W/VALVE (MISCELLANEOUS) ×4 IMPLANT
CATH ROBINSON RED A/P 10FR (CATHETERS) IMPLANT
COVER BACK TABLE 60X90IN (DRAPES) ×4 IMPLANT
COVER MAYO STAND STRL (DRAPES) ×4 IMPLANT
DECANTER SPIKE VIAL GLASS SM (MISCELLANEOUS) IMPLANT
DRAPE U-SHAPE 76X120 STRL (DRAPES) ×4 IMPLANT
GAUZE PACKING FOLDED 2  STR (GAUZE/BANDAGES/DRESSINGS) ×2
GAUZE PACKING FOLDED 2 STR (GAUZE/BANDAGES/DRESSINGS) ×2 IMPLANT
GAUZE PACKING IODOFORM 1/4X15 (GAUZE/BANDAGES/DRESSINGS) IMPLANT
GLOVE BIO SURGEON STRL SZ 6.5 (GLOVE) ×1 IMPLANT
GLOVE BIO SURGEON STRL SZ7.5 (GLOVE) ×1 IMPLANT
GLOVE BIO SURGEONS STRL SZ 6.5 (GLOVE)
GLOVE BIOGEL PI IND STRL 7.0 (GLOVE) ×2 IMPLANT
GLOVE BIOGEL PI INDICATOR 7.0 (GLOVE) ×2
GLOVE SURG SS PI 6.5 STRL IVOR (GLOVE) ×3 IMPLANT
GLOVE SURG SS PI 7.5 STRL IVOR (GLOVE) ×3 IMPLANT
GOWN STRL REUS W/ TWL LRG LVL3 (GOWN DISPOSABLE) ×2 IMPLANT
GOWN STRL REUS W/ TWL XL LVL3 (GOWN DISPOSABLE) ×2 IMPLANT
GOWN STRL REUS W/TWL LRG LVL3 (GOWN DISPOSABLE) ×2
GOWN STRL REUS W/TWL XL LVL3 (GOWN DISPOSABLE) ×2
IV NS 500ML (IV SOLUTION) ×2
IV NS 500ML BAXH (IV SOLUTION) ×2 IMPLANT
NEEDLE HYPO 22GX1.5 SAFETY (NEEDLE) ×7 IMPLANT
PACK BASIN DAY SURGERY FS (CUSTOM PROCEDURE TRAY) ×4 IMPLANT
SLEEVE SCD COMPRESS KNEE MED (MISCELLANEOUS) ×3 IMPLANT
SPONGE SURGIFOAM ABS GEL 12-7 (HEMOSTASIS) IMPLANT
SUT CHROMIC 3 0 PS 2 (SUTURE) ×4 IMPLANT
SYR BULB 3OZ (MISCELLANEOUS) ×4 IMPLANT
SYR CONTROL 10ML LL (SYRINGE) ×4 IMPLANT
TOOTHBRUSH ADULT (PERSONAL CARE ITEMS) IMPLANT
TOWEL GREEN STERILE FF (TOWEL DISPOSABLE) ×4 IMPLANT
TOWEL OR NON WOVEN STRL DISP B (DISPOSABLE) ×1 IMPLANT
TRAY DSU PREP LF (CUSTOM PROCEDURE TRAY) IMPLANT
TUBE CONNECTING 20'X1/4 (TUBING) ×1
TUBE CONNECTING 20X1/4 (TUBING) ×3 IMPLANT
TUBING IRRIGATION (MISCELLANEOUS) ×4 IMPLANT
WATER STERILE IRR 1000ML POUR (IV SOLUTION) ×1 IMPLANT
YANKAUER SUCT BULB TIP NO VENT (SUCTIONS) ×4 IMPLANT

## 2018-06-06 NOTE — Anesthesia Postprocedure Evaluation (Signed)
Anesthesia Post Note  Patient: Marie Fox  Procedure(s) Performed: MULTIPLE EXTRACTION WITH ALVEOLOPLASTY (Bilateral Mouth)     Patient location during evaluation: PACU Anesthesia Type: General Level of consciousness: awake and alert Pain management: pain level controlled Vital Signs Assessment: post-procedure vital signs reviewed and stable Respiratory status: spontaneous breathing, nonlabored ventilation, respiratory function stable and patient connected to nasal cannula oxygen Cardiovascular status: blood pressure returned to baseline and stable Postop Assessment: no apparent nausea or vomiting Anesthetic complications: no    Last Vitals:  Vitals:   06/06/18 0830 06/06/18 0845  BP: 111/68 113/81  Pulse: 80 81  Resp: 17 (!) 22  Temp:    SpO2: 98% 92%    Last Pain:  Vitals:   06/06/18 0845  TempSrc:   PainSc: 0-No pain                 Eulala Newcombe S

## 2018-06-06 NOTE — Discharge Instructions (Signed)

## 2018-06-06 NOTE — Anesthesia Preprocedure Evaluation (Signed)
Anesthesia Evaluation  Patient identified by MRN, date of birth, ID band Patient awake    Reviewed: Allergy & Precautions, NPO status , Patient's Chart, lab work & pertinent test results  Airway Mallampati: II  TM Distance: >3 FB Neck ROM: Full    Dental no notable dental hx.    Pulmonary neg pulmonary ROS, former smoker,    Pulmonary exam normal breath sounds clear to auscultation       Cardiovascular hypertension, Normal cardiovascular exam Rhythm:Regular Rate:Normal     Neuro/Psych Anxiety negative neurological ROS     GI/Hepatic negative GI ROS, Neg liver ROS,   Endo/Other  diabetes  Renal/GU negative Renal ROS  negative genitourinary   Musculoskeletal negative musculoskeletal ROS (+)   Abdominal   Peds negative pediatric ROS (+)  Hematology negative hematology ROS (+)   Anesthesia Other Findings   Reproductive/Obstetrics negative OB ROS                             Anesthesia Physical Anesthesia Plan  ASA: III  Anesthesia Plan: General   Post-op Pain Management:    Induction: Intravenous  PONV Risk Score and Plan: 3 and Ondansetron, Midazolam and Treatment may vary due to age or medical condition  Airway Management Planned: Nasal ETT  Additional Equipment:   Intra-op Plan:   Post-operative Plan: Extubation in OR  Informed Consent: I have reviewed the patients History and Physical, chart, labs and discussed the procedure including the risks, benefits and alternatives for the proposed anesthesia with the patient or authorized representative who has indicated his/her understanding and acceptance.   Dental advisory given  Plan Discussed with: CRNA and Surgeon  Anesthesia Plan Comments:         Anesthesia Quick Evaluation

## 2018-06-06 NOTE — Op Note (Signed)
06/06/2018  8:07 AM  PATIENT:  [name]  57 y.o. female  PRE-OPERATIVE DIAGNOSIS:  NON-RESTORABLE TEETH # 2, 3, 4, 6, 7, 8, 9, 11, 20, 32  POST-OPERATIVE DIAGNOSIS:  SAME  PROCEDURE:  Procedure(s): MULTIPLE EXTRACTION TEETH # 2, 3, 4, 6, 7, 8, 9, 11, 20, 32;  ALVEOLOPLASTY RIGHT AND LEFT MAXILLA  SURGEON:  Surgeon(s): Diona Browner, DDS  ANESTHESIA:   local and general  EBL:  minimal  DRAINS: none   SPECIMEN:  No Specimen  COUNTS:  YES  PLAN OF CARE: Discharge to home after PACU  PATIENT DISPOSITION:  PACU - hemodynamically stable.   PROCEDURE DETAILS: Dictation #[id number]  Gae Bon, DMD 06/06/2018 8:07 AM

## 2018-06-06 NOTE — Anesthesia Procedure Notes (Signed)
Procedure Name: Intubation Date/Time: 06/06/2018 7:39 AM Performed by: Raenette Rover, CRNA Pre-anesthesia Checklist: Patient identified, Emergency Drugs available, Suction available and Patient being monitored Patient Re-evaluated:Patient Re-evaluated prior to induction Oxygen Delivery Method: Circle system utilized Preoxygenation: Pre-oxygenation with 100% oxygen Induction Type: IV induction Ventilation: Mask ventilation without difficulty Laryngoscope Size: Mac and 3 Grade View: Grade I Nasal Tubes: Nasal Rae, Magill forceps- large, utilized and Nasal prep performed Tube size: 7.0 mm Number of attempts: 1 Placement Confirmation: ETT inserted through vocal cords under direct vision,  positive ETCO2,  CO2 detector and breath sounds checked- equal and bilateral Secured at: 24 cm Tube secured with: Tape Dental Injury: Teeth and Oropharynx as per pre-operative assessment

## 2018-06-06 NOTE — Transfer of Care (Signed)
Immediate Anesthesia Transfer of Care Note  Patient: Marie Fox  Procedure(s) Performed: MULTIPLE EXTRACTION WITH ALVEOLOPLASTY (Bilateral Mouth)  Patient Location: PACU  Anesthesia Type:General  Level of Consciousness: awake, alert , oriented, drowsy and patient cooperative  Airway & Oxygen Therapy: Patient Spontanous Breathing and Patient connected to face mask oxygen  Post-op Assessment: Report given to RN and Post -op Vital signs reviewed and stable  Post vital signs: Reviewed and stable  Last Vitals:  Vitals Value Taken Time  BP 116/77 06/06/2018  8:24 AM  Temp    Pulse 83 06/06/2018  8:27 AM  Resp 18 06/06/2018  8:27 AM  SpO2 97 % 06/06/2018  8:27 AM  Vitals shown include unvalidated device data.  Last Pain:  Vitals:   06/06/18 0720  TempSrc:   PainSc: 0-No pain         Complications: No apparent anesthesia complications

## 2018-06-06 NOTE — Op Note (Signed)
NAME: Marie Fox, Marie Fox MEDICAL RECORD [id number] ACCOUNT [id number] DATE OF BIRTH:1961-04-04 FACILITY: [facility] LOCATION: [facility] PHYSICIAN:Tyreck Bell M. Alyson Ki, DDS  OPERATIVE REPORT  DATE OF PROCEDURE:  06/06/2018  PREOPERATIVE DIAGNOSES:  Nonrestorable teeth numbers 2, 3, 4, 6, 7, 8, 9, 11, 20, 32 secondary to dental caries and periodontitis.  POSTOPERATIVE DIAGNOSIS:  Nonrestorable teeth numbers 2, 3, 4, 6, 7, 8, 9, 11, 20, 32 secondary to dental caries and periodontitis.  PROCEDURE:  Extraction of teeth numbers 2, 3, 4, 6, 7, 8, 9, 11, 20, 32, alveoplasty right and left maxilla.  SURGEON:  Diona Browner, DDS  ANESTHESIA:  General nasal intubation.  INDICATIONS FOR PROCEDURE:  The patient is a 57 year old female with past medical history of hypertension, diabetes, GERD and obesity.  Because of the extensiveness of the surgery, it was recommended that she undergo the procedure under general  anesthesia.  DESCRIPTION OF PROCEDURE:  The patient was taken to the operating room and placed on the table in supine position.  General anesthesia was administered intravenously.  A nasal endotracheal tube was inserted atraumatically and secured.  The eyes were  protected and the patient was draped for surgery.  A timeout was performed.  The posterior pharynx was suctioned, and a throat pack was placed.  Lidocaine 2% 1:100,000 epinephrine was infiltrated in the inferior alveolar block on the right and left sides  and in buccal and palatal infiltration around the upper teeth that were scheduled to be removed.  A total of 17 mL of solution was utilized.  A bite block was placed on the right side of the mouth, and a sweetheart retractor was used to retract the  tongue.  A 15 blade was used to make an incision circumferentially around tooth #20, and then in the left maxilla, the 15 blade was used to make an incision along the alveolar crest in an area of approximately the first molar carried forward to tooth  #11  and then carried buccally and palatally around teeth numbers 11, 9, 8, 7, 6.  The periosteum was reflected from around these teeth in the maxilla and mandible on the left side, and then the 301 elevator was used to elevate the teeth.  The teeth were  removed with dental forceps.  The socket were curetted.  Tissue was trimmed around tooth #20 to allow for a smooth contour, and then the periosteum was reflected in the left maxilla to expose the alveolar crest which had contained bony protuberances and  irregularities due to the sockets of teeth.  The egg-shaped bur and bone file were then used to perform the alveoplasty in the left maxilla.  Then, the area was irrigated and closed with 3-0 chromic.  The bite block and sweetheart retractor were  repositioned to the other side of the mouth, and a 15 blade was used to make an incision around teeth numbers 2, 3, 4 both buccally and palatally in the gingival sulcus and around tooth #32 in the mandible.  The periosteum was reflected from around these  teeth.  The teeth were elevated with a 301 elevator and removed from the mouth with the dental forceps.  The sockets were curetted.  The periosteum was reflected around teeth numbers 2, 3, 4.  The incision was carried along the alveolar crest to tooth  #6 region.  Then, alveoplasty was performed using the egg-shaped bur and bone file.  Then, the area was irrigated and closed with 3-0 chromic.  The oral cavity was inspected and found to  have good contour, hemostasis, and closure.  The oral cavity was  irrigated and suctioned.  The throat pack was removed.  The patient was then left in the care of anesthesia for awakening and transport to recovery room with plans for discharge home through day surgery.  ESTIMATED BLOOD LOSS:  Minimal.  COMPLICATIONS:  None.  SPECIMENS:  None.  LN/NUANCE  D:06/06/2018 T:06/06/2018 JOB:[id number]

## 2018-06-06 NOTE — H&P (Signed)
H&P documentation  -History and Physical Reviewed  -Patient has been re-examined  -No change in the plan of care  Marie Fox  

## 2018-06-09 ENCOUNTER — Encounter (HOSPITAL_BASED_OUTPATIENT_CLINIC_OR_DEPARTMENT_OTHER): Payer: Self-pay | Admitting: Oral Surgery

## 2020-02-26 ENCOUNTER — Other Ambulatory Visit: Payer: Self-pay

## 2020-02-26 ENCOUNTER — Emergency Department (HOSPITAL_COMMUNITY): Payer: Medicaid Other

## 2020-02-26 ENCOUNTER — Emergency Department (HOSPITAL_COMMUNITY)
Admission: EM | Admit: 2020-02-26 | Discharge: 2020-02-26 | Disposition: A | Discharge status: Home / Self Care | Payer: Medicaid Other | Attending: Emergency Medicine | Admitting: Emergency Medicine

## 2020-02-26 ENCOUNTER — Encounter (HOSPITAL_COMMUNITY): Payer: Self-pay

## 2020-02-26 DIAGNOSIS — Z87891 Personal history of nicotine dependence: Secondary | ICD-10-CM | POA: Diagnosis not present

## 2020-02-26 DIAGNOSIS — I10 Essential (primary) hypertension: Secondary | ICD-10-CM | POA: Insufficient documentation

## 2020-02-26 DIAGNOSIS — Z794 Long term (current) use of insulin: Secondary | ICD-10-CM | POA: Insufficient documentation

## 2020-02-26 DIAGNOSIS — Y9389 Activity, other specified: Secondary | ICD-10-CM | POA: Diagnosis not present

## 2020-02-26 DIAGNOSIS — S301XXA Contusion of abdominal wall, initial encounter: Secondary | ICD-10-CM | POA: Diagnosis not present

## 2020-02-26 DIAGNOSIS — M62838 Other muscle spasm: Secondary | ICD-10-CM | POA: Diagnosis not present

## 2020-02-26 DIAGNOSIS — Y999 Unspecified external cause status: Secondary | ICD-10-CM | POA: Insufficient documentation

## 2020-02-26 DIAGNOSIS — Z79899 Other long term (current) drug therapy: Secondary | ICD-10-CM | POA: Insufficient documentation

## 2020-02-26 DIAGNOSIS — R519 Headache, unspecified: Secondary | ICD-10-CM | POA: Diagnosis not present

## 2020-02-26 DIAGNOSIS — Y92410 Unspecified street and highway as the place of occurrence of the external cause: Secondary | ICD-10-CM | POA: Diagnosis not present

## 2020-02-26 DIAGNOSIS — E119 Type 2 diabetes mellitus without complications: Secondary | ICD-10-CM | POA: Diagnosis not present

## 2020-02-26 DIAGNOSIS — S0990XA Unspecified injury of head, initial encounter: Secondary | ICD-10-CM | POA: Diagnosis not present

## 2020-02-26 DIAGNOSIS — M25511 Pain in right shoulder: Secondary | ICD-10-CM | POA: Diagnosis not present

## 2020-02-26 DIAGNOSIS — S199XXA Unspecified injury of neck, initial encounter: Secondary | ICD-10-CM | POA: Diagnosis present

## 2020-02-26 LAB — CBC WITH DIFFERENTIAL/PLATELET
Abs Immature Granulocytes: 0.03 10*3/uL (ref 0.00–0.07)
Basophils Absolute: 0.1 10*3/uL (ref 0.0–0.1)
Basophils Relative: 1 %
Eosinophils Absolute: 0.1 10*3/uL (ref 0.0–0.5)
Eosinophils Relative: 2 %
HCT: 40.8 % (ref 36.0–46.0)
Hemoglobin: 13.4 g/dL (ref 12.0–15.0)
Immature Granulocytes: 0 %
Lymphocytes Relative: 44 %
Lymphs Abs: 4.1 10*3/uL — ABNORMAL HIGH (ref 0.7–4.0)
MCH: 26.2 pg (ref 26.0–34.0)
MCHC: 32.8 g/dL (ref 30.0–36.0)
MCV: 79.8 fL — ABNORMAL LOW (ref 80.0–100.0)
Monocytes Absolute: 0.7 10*3/uL (ref 0.1–1.0)
Monocytes Relative: 8 %
Neutro Abs: 4.1 10*3/uL (ref 1.7–7.7)
Neutrophils Relative %: 45 %
Platelets: 508 10*3/uL — ABNORMAL HIGH (ref 150–400)
RBC: 5.11 MIL/uL (ref 3.87–5.11)
RDW: 14.6 % (ref 11.5–15.5)
WBC: 9.2 10*3/uL (ref 4.0–10.5)
nRBC: 0 % (ref 0.0–0.2)

## 2020-02-26 LAB — COMPREHENSIVE METABOLIC PANEL
ALT: 30 U/L (ref 0–44)
AST: 37 U/L (ref 15–41)
Albumin: 4.4 g/dL (ref 3.5–5.0)
Alkaline Phosphatase: 98 U/L (ref 38–126)
Anion gap: 11 (ref 5–15)
BUN: 13 mg/dL (ref 6–20)
CO2: 26 mmol/L (ref 22–32)
Calcium: 9.4 mg/dL (ref 8.9–10.3)
Chloride: 101 mmol/L (ref 98–111)
Creatinine, Ser: 1.3 mg/dL — ABNORMAL HIGH (ref 0.44–1.00)
GFR calc Af Amer: 52 mL/min — ABNORMAL LOW (ref >60)
GFR calc non Af Amer: 45 mL/min — ABNORMAL LOW (ref >60)
Glucose, Bld: 135 mg/dL — ABNORMAL HIGH (ref 70–99)
Potassium: 3.9 mmol/L (ref 3.5–5.1)
Sodium: 138 mmol/L (ref 135–145)
Total Bilirubin: 0.5 mg/dL (ref 0.3–1.2)
Total Protein: 8.2 g/dL — ABNORMAL HIGH (ref 6.5–8.1)

## 2020-02-26 MED ORDER — IOHEXOL 300 MG/ML  SOLN
100.0000 mL | Freq: Once | INTRAMUSCULAR | Status: AC | PRN
  Administered 2020-02-26: 16:00:00 100 mL via INTRAVENOUS

## 2020-02-26 MED ORDER — FENTANYL CITRATE (PF) 100 MCG/2ML IJ SOLN
50.0000 ug | Freq: Once | INTRAMUSCULAR | Status: AC
  Administered 2020-02-26: 14:00:00 50 ug via INTRAVENOUS
  Filled 2020-02-26: qty 2

## 2020-02-26 MED ORDER — SODIUM CHLORIDE (PF) 0.9 % IJ SOLN
INTRAMUSCULAR | Status: AC
  Filled 2020-02-26: qty 50

## 2020-02-26 MED ORDER — METHOCARBAMOL 500 MG PO TABS: 500.0000 mg | ORAL_TABLET | Freq: Two times a day (BID) | ORAL | 0 refills | Status: AC

## 2020-02-26 MED ORDER — ONDANSETRON HCL 4 MG/2ML IJ SOLN
4.0000 mg | Freq: Once | INTRAMUSCULAR | Status: AC
  Administered 2020-02-26: 4 mg via INTRAVENOUS
  Filled 2020-02-26: qty 2

## 2020-02-26 NOTE — ED Notes (Signed)
An After Visit Summary was printed and given to the patient. Discharge instructions given and no further questions at this time. Pt ambulated out with patients cane.
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
## 2020-02-26 NOTE — Discharge Instructions (Signed)
The pain you are experiencing is likely due to muscle strain, you may take tylenol and Robaxin as needed for pain management. You may also use ice and heat, and over-the-counter remedies such as Biofreeze gel or salon pas lidocaine patches. The muscle soreness should improve over the next week. Follow up with your family doctor in the next week for a recheck if you are still having symptoms. Return to ED if pain is worsening, you develop weakness or numbness of extremities, or new or concerning symptoms develop. ° °
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
## 2023-03-21 ENCOUNTER — Other Ambulatory Visit: Payer: Self-pay

## 2023-03-21 ENCOUNTER — Other Ambulatory Visit (HOSPITAL_BASED_OUTPATIENT_CLINIC_OR_DEPARTMENT_OTHER): Payer: Self-pay

## 2023-03-21 ENCOUNTER — Other Ambulatory Visit (HOSPITAL_COMMUNITY): Payer: Self-pay

## 2023-03-21 MED ORDER — MOUNJARO 2.5 MG/0.5ML ~~LOC~~ SOAJ
SUBCUTANEOUS | 5 refills | Status: DC
  Filled 2023-03-21 (×2): qty 2, 28d supply, fill #0
  Filled 2023-04-11: qty 2, 28d supply, fill #1

## 2023-03-22 ENCOUNTER — Other Ambulatory Visit (HOSPITAL_BASED_OUTPATIENT_CLINIC_OR_DEPARTMENT_OTHER): Payer: Self-pay

## 2023-04-11 ENCOUNTER — Other Ambulatory Visit (HOSPITAL_COMMUNITY): Payer: Self-pay

## 2023-04-12 ENCOUNTER — Other Ambulatory Visit (HOSPITAL_COMMUNITY): Payer: Self-pay

## 2023-04-15 ENCOUNTER — Other Ambulatory Visit (HOSPITAL_COMMUNITY): Payer: Self-pay

## 2023-04-15 ENCOUNTER — Other Ambulatory Visit: Payer: Self-pay

## 2023-05-08 ENCOUNTER — Other Ambulatory Visit (HOSPITAL_COMMUNITY): Payer: Self-pay

## 2024-06-05 ENCOUNTER — Encounter: Payer: Self-pay | Admitting: Physical Medicine and Rehabilitation

## 2024-08-07 ENCOUNTER — Encounter: Admitting: Physical Medicine and Rehabilitation

## 2024-10-01 ENCOUNTER — Encounter: Admitting: Physical Medicine and Rehabilitation
